# Patient Record
Sex: Male | Born: 1960 | Race: White | Hispanic: No | Marital: Single | Smoking: Former smoker
Health system: Southern US, Community
[De-identification: ages and names within clinical notes are randomized; demographics above are authoritative.]

## PROBLEM LIST (undated history)

## (undated) DIAGNOSIS — R112 Nausea with vomiting, unspecified: Secondary | ICD-10-CM

## (undated) DIAGNOSIS — G709 Myoneural disorder, unspecified: Secondary | ICD-10-CM

## (undated) DIAGNOSIS — I10 Essential (primary) hypertension: Secondary | ICD-10-CM

## (undated) DIAGNOSIS — Z9889 Other specified postprocedural states: Secondary | ICD-10-CM

## (undated) DIAGNOSIS — J449 Chronic obstructive pulmonary disease, unspecified: Secondary | ICD-10-CM

## (undated) DIAGNOSIS — G894 Chronic pain syndrome: Secondary | ICD-10-CM

## (undated) DIAGNOSIS — M1990 Unspecified osteoarthritis, unspecified site: Secondary | ICD-10-CM

## (undated) DIAGNOSIS — Z8489 Family history of other specified conditions: Secondary | ICD-10-CM

## (undated) HISTORY — PX: LUMBAR EPIDURAL INJECTION: SHX1980

## (undated) HISTORY — PX: WISDOM TOOTH EXTRACTION: SHX21

---

## 1998-04-27 ENCOUNTER — Encounter: Payer: Self-pay | Admitting: Emergency Medicine

## 1998-04-27 ENCOUNTER — Emergency Department (HOSPITAL_COMMUNITY): Admission: EM | Admit: 1998-04-27 | Discharge: 1998-04-27 | Payer: Self-pay | Admitting: Emergency Medicine

## 2006-12-17 ENCOUNTER — Emergency Department (HOSPITAL_COMMUNITY): Admission: EM | Admit: 2006-12-17 | Discharge: 2006-12-17 | Payer: Self-pay | Admitting: Emergency Medicine

## 2006-12-17 IMAGING — CR DG KNEE COMPLETE 4+V*L*
4 series · 4 of 4 positions shown · non-contrast
Comparison: none

CLINICAL DATA: Left knee pain while lifting.  
 LEFT KNEE ? 4 VIEW:

[view not recorded (1 of 4)]
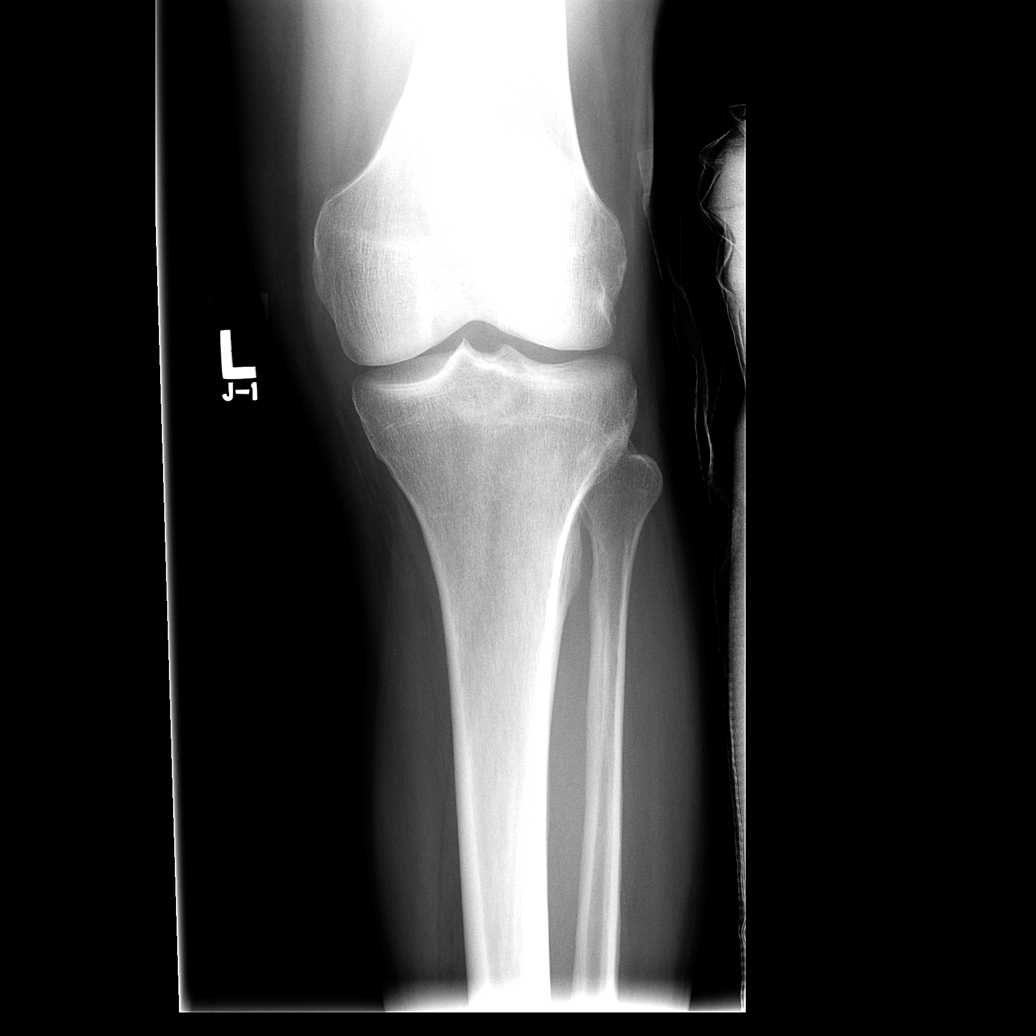

[view not recorded (2 of 4)]
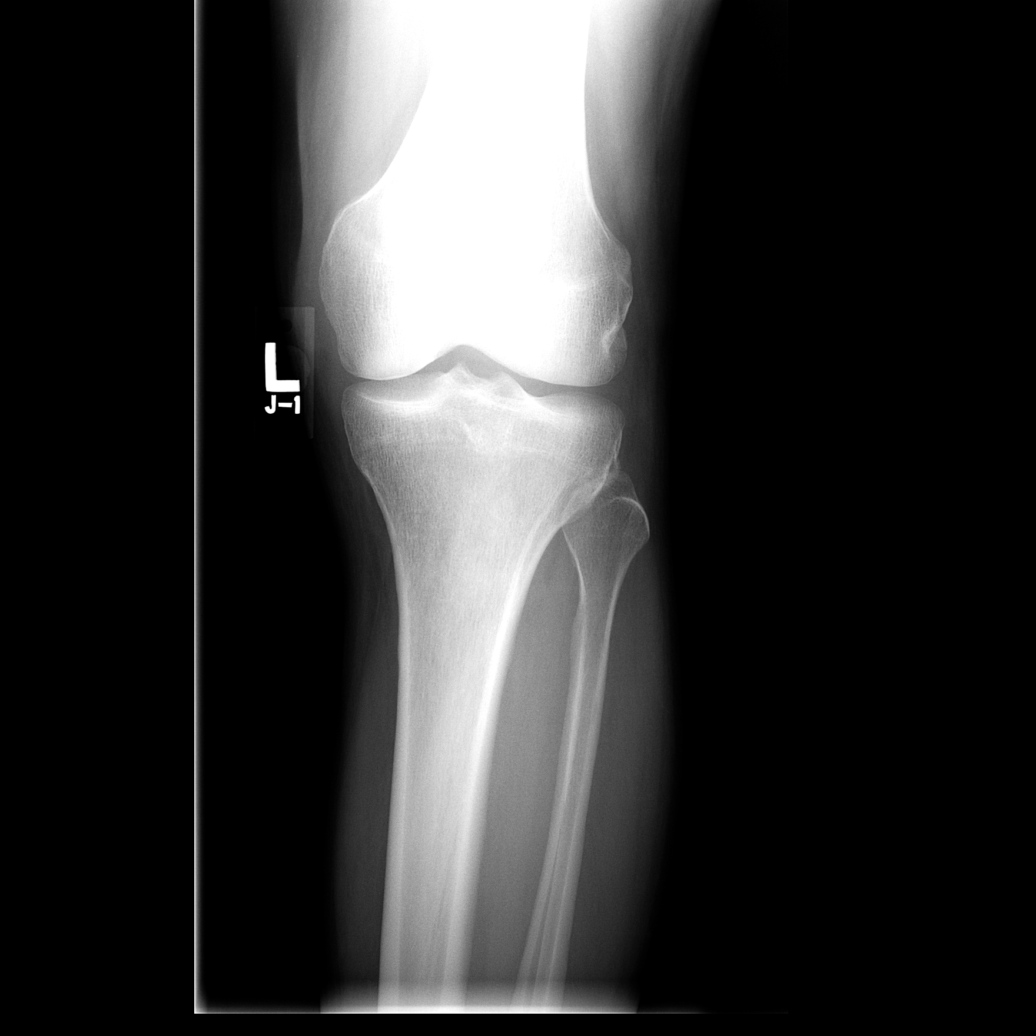

[view not recorded (3 of 4)]
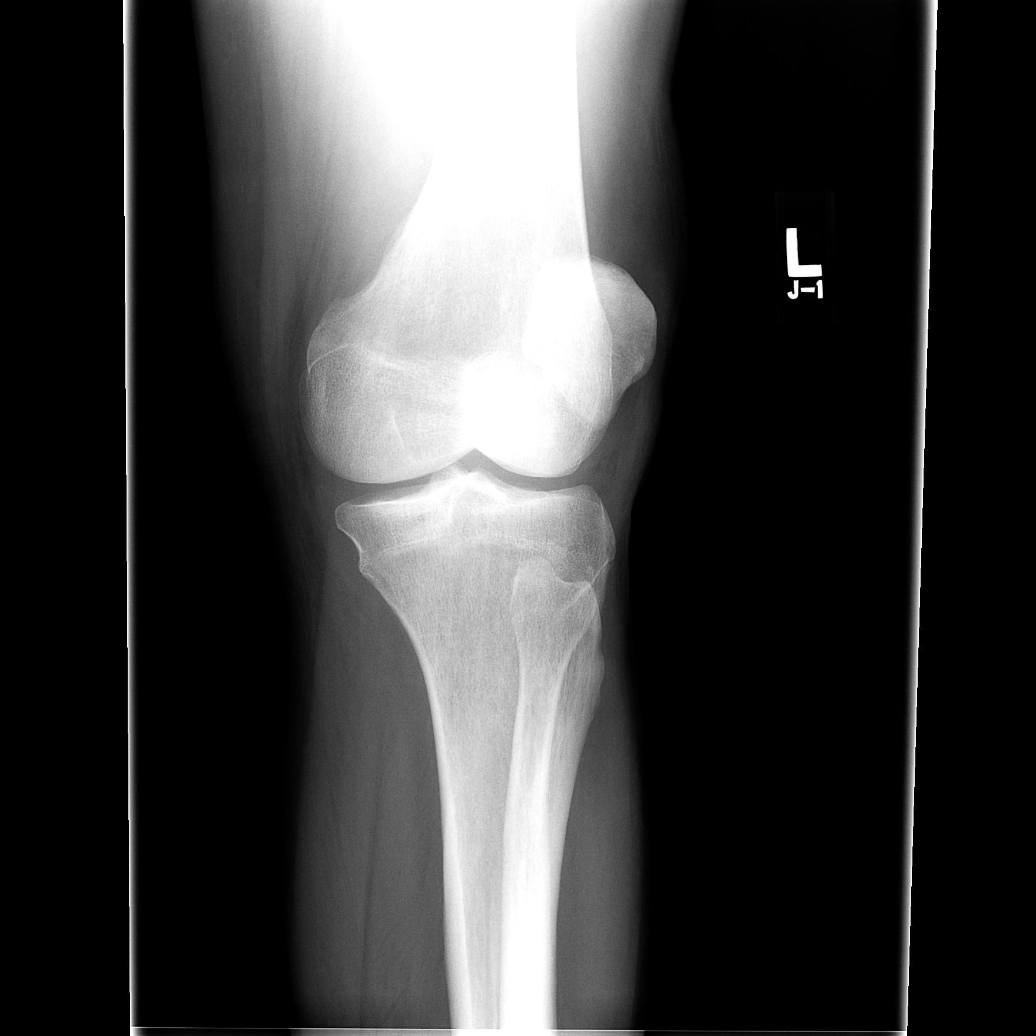

[view not recorded (4 of 4)]
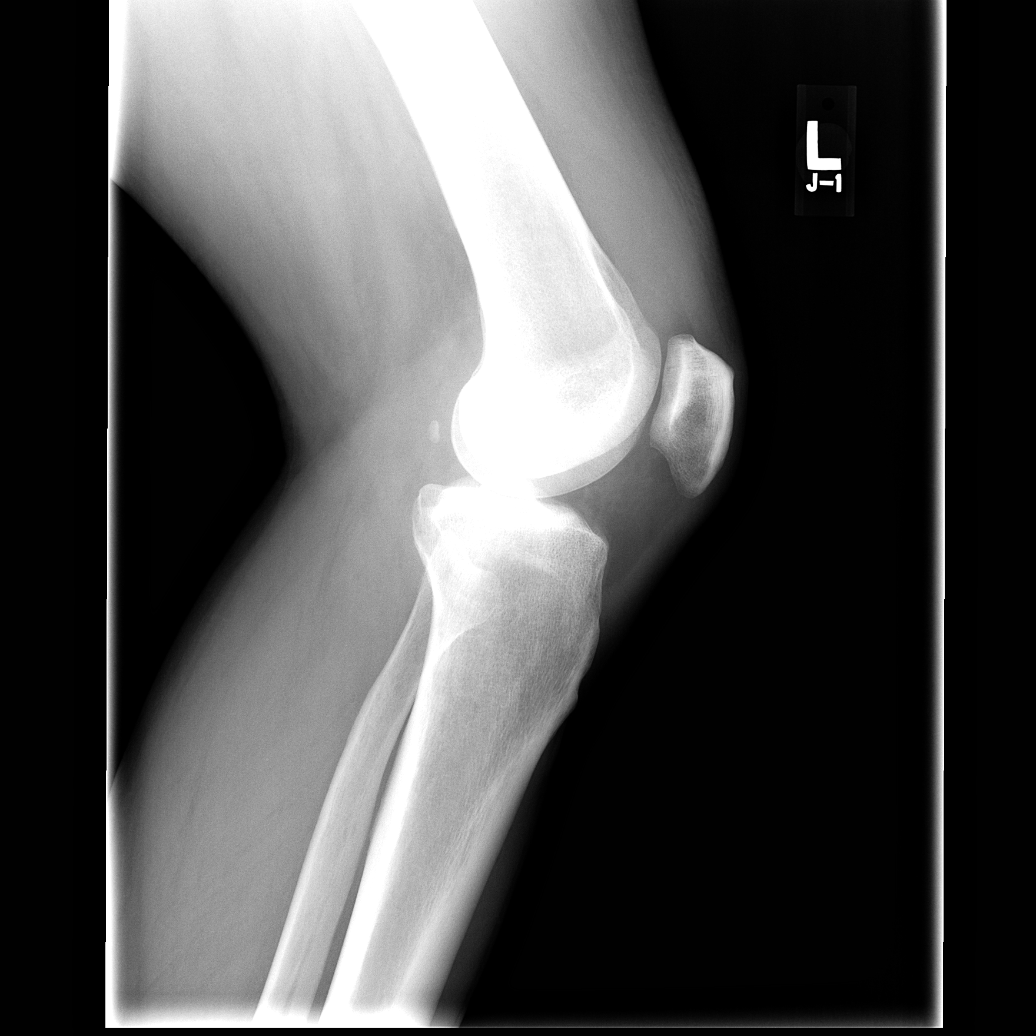

[4 of 4 positions shown; findings below may reference images not displayed]

FINDINGS: Small knee joint effusion is noted.   There is no evidence of fracture or dislocation.  There is no evidence of arthropathy or other bone lesions.
IMPRESSION: 1.  Small knee joint effusion.  
 2.  No bone abnormality identified.

## 2010-11-07 ENCOUNTER — Ambulatory Visit: Payer: Self-pay

## 2010-11-24 ENCOUNTER — Other Ambulatory Visit: Payer: Self-pay | Admitting: Gastroenterology

## 2021-05-30 ENCOUNTER — Other Ambulatory Visit: Payer: Self-pay | Admitting: Neurological Surgery

## 2021-05-30 DIAGNOSIS — M5416 Radiculopathy, lumbar region: Secondary | ICD-10-CM

## 2021-06-12 ENCOUNTER — Ambulatory Visit
Admission: RE | Admit: 2021-06-12 | Discharge: 2021-06-12 | Disposition: A | Discharge status: Home / Self Care | Payer: Self-pay | Source: Ambulatory Visit | Attending: Neurological Surgery | Admitting: Neurological Surgery

## 2021-06-12 DIAGNOSIS — M5416 Radiculopathy, lumbar region: Secondary | ICD-10-CM

## 2021-06-12 IMAGING — MR MR LUMBAR SPINE W/O CM
4 of 5 series · 24 of 48 positions shown · non-contrast
Comparison: No prior MRI, correlation is made with lumbar spine
radiographs [DATE]

CLINICAL DATA: Lower back pain, radiating down right leg and hip.

EXAM:
MRI LUMBAR SPINE WITHOUT CONTRAST
TECHNIQUE: Multiplanar, multisequence MR imaging of the lumbar spine was
performed. No intravenous contrast was administered.

[Series 3: T2 · sagittal · 4.0mm · 0.53mm/px · 6 of 18 slices shown (1 of 2)]
[im 1/18]
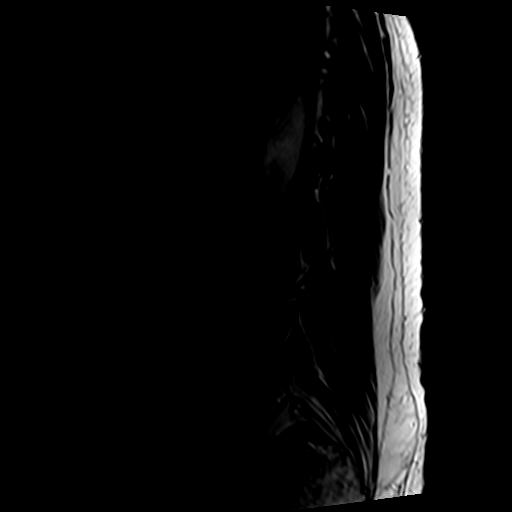
[im 4/18]
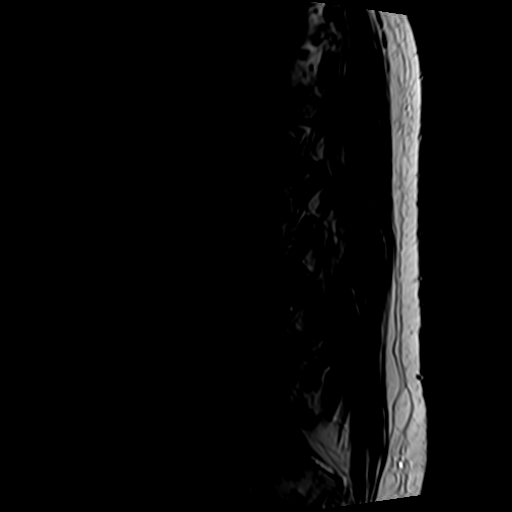
[im 7/18]
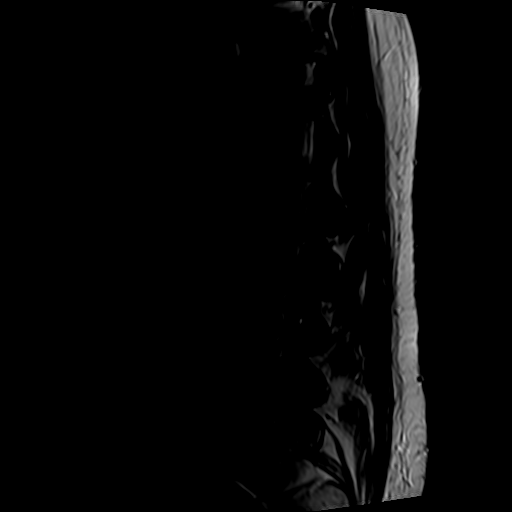
[im 11/18]
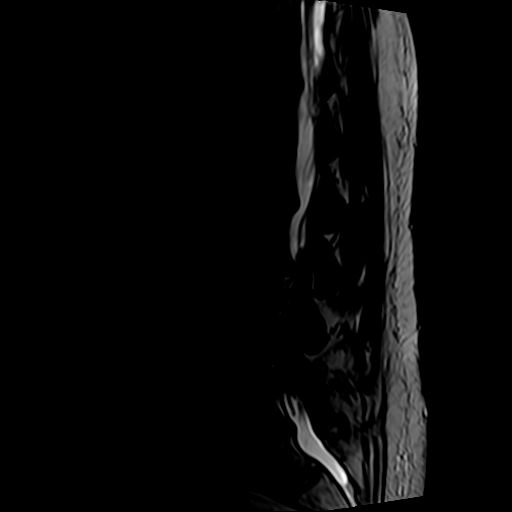
[im 14/18]
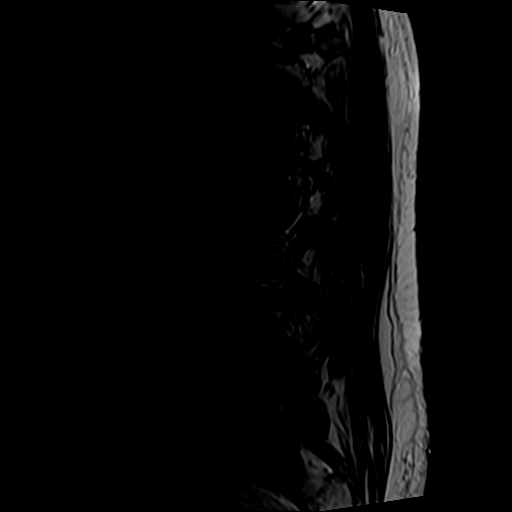
[im 18/18]
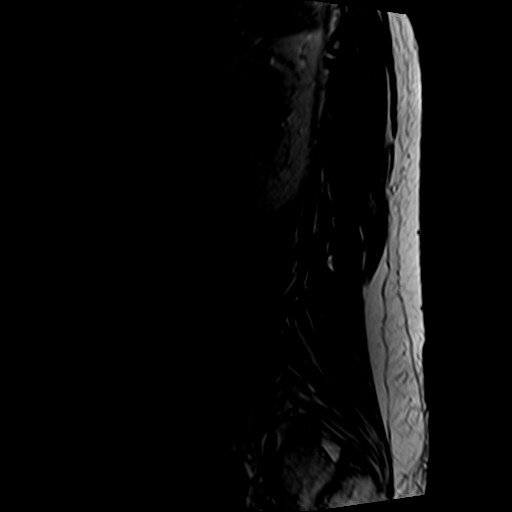

[Series 5: T1 · sagittal · 4.0mm · 0.53mm/px · 7 of 18 slices shown (1 of 2)]
[im 1/18]
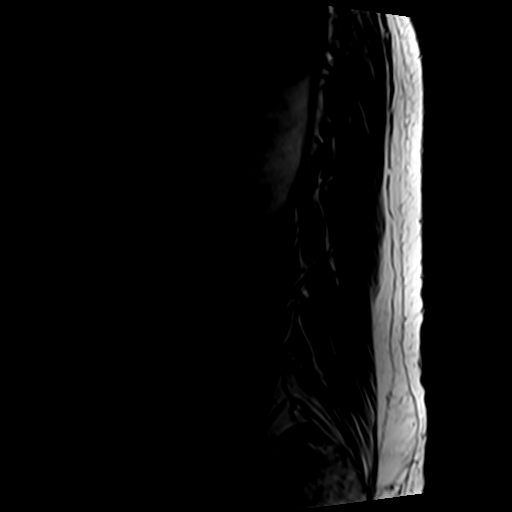
[im 3/18]
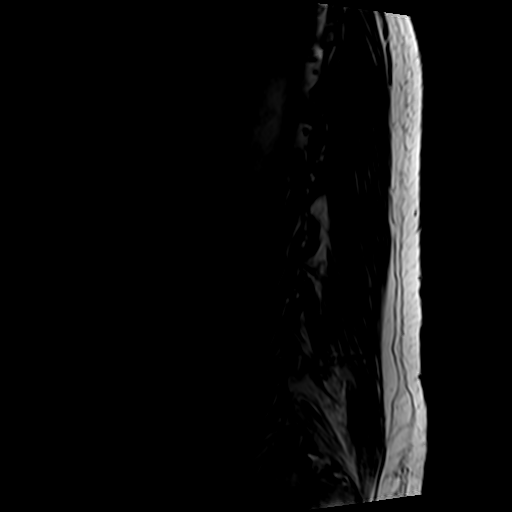
[im 6/18]
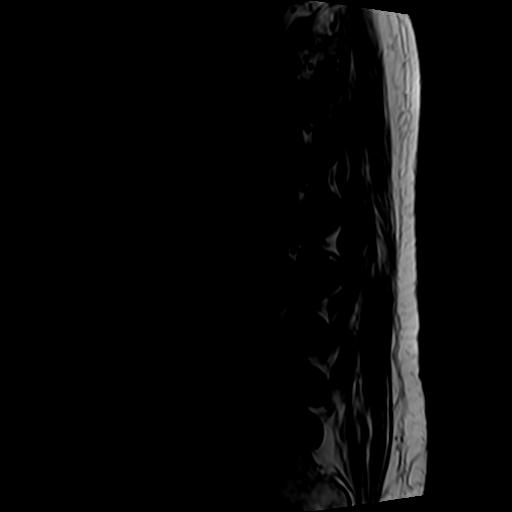
[im 9/18]
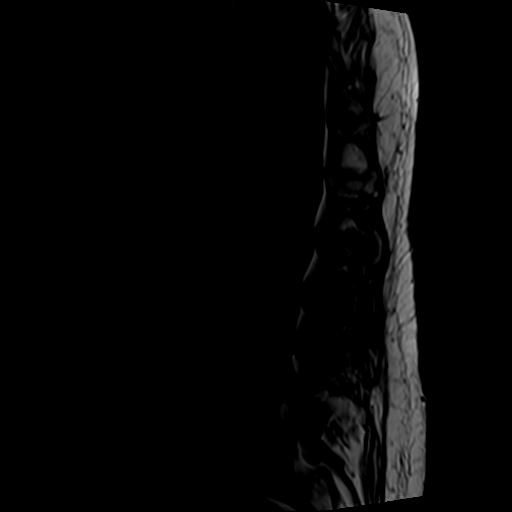
[im 12/18]
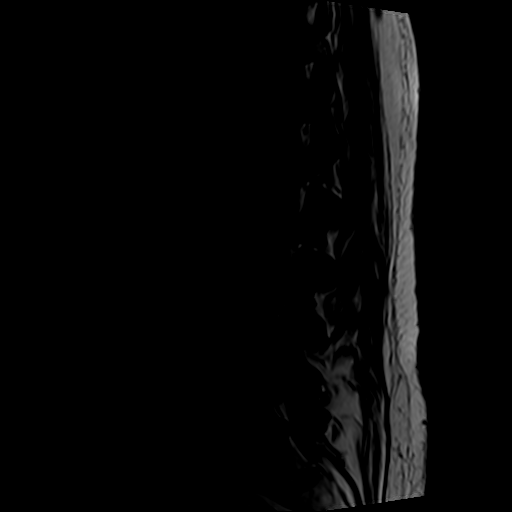
[im 15/18]
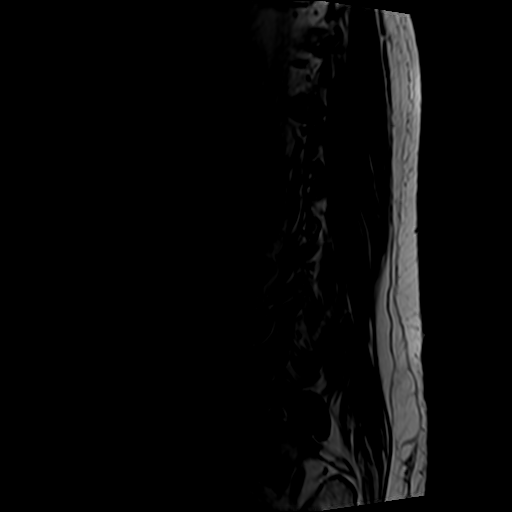
[im 18/18]
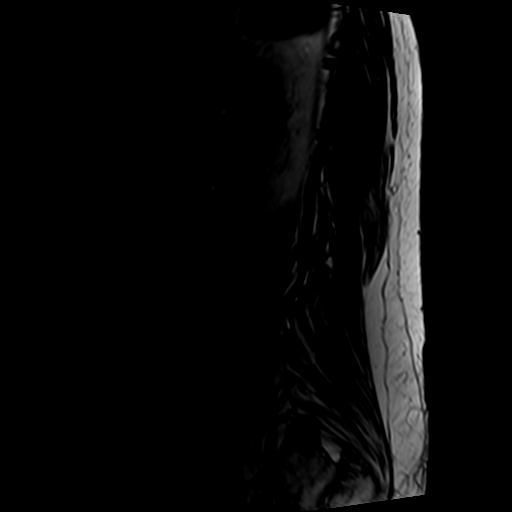

[Series 6: T2 · axial · 4.0mm · 0.70mm/px · z∈[-64,+147]mm · 8 of 35 slices shown (2 of 2)]
[im 1/35]
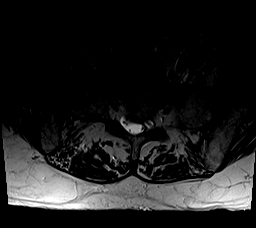
[im 6/35]
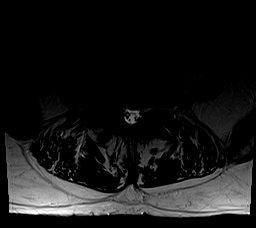
[im 11/35]
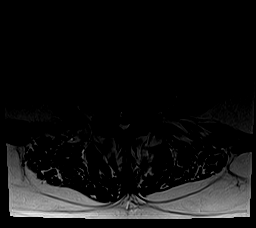
[im 16/35]
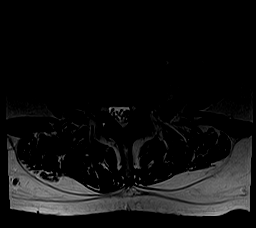
[im 19/35]
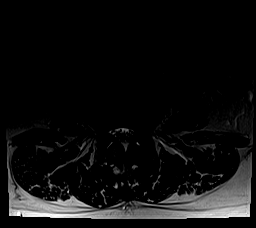
[im 24/35]
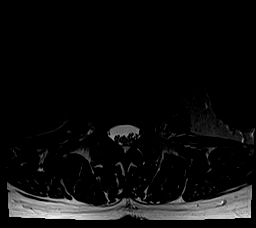
[im 29/35]
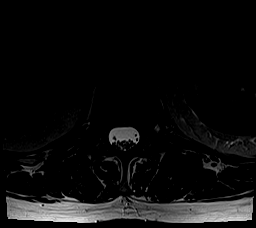
[im 35/35]
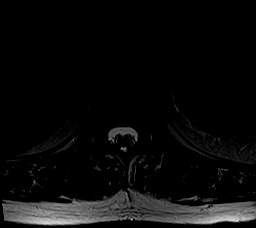

[Series 7: T1 · axial · 4.0mm · 0.35mm/px · z∈[-39,+116]mm · 3 of 35 slices shown (2 of 2)]
[im 6/35]
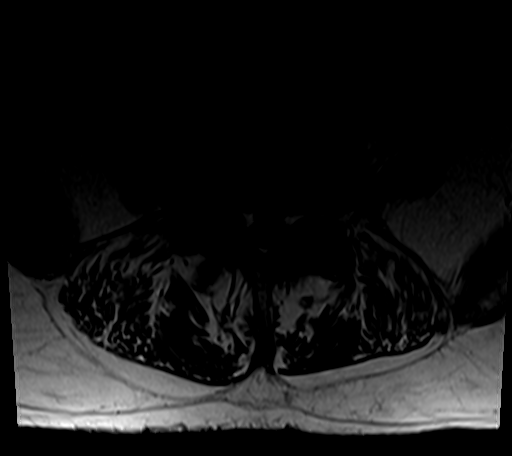
[im 19/35]
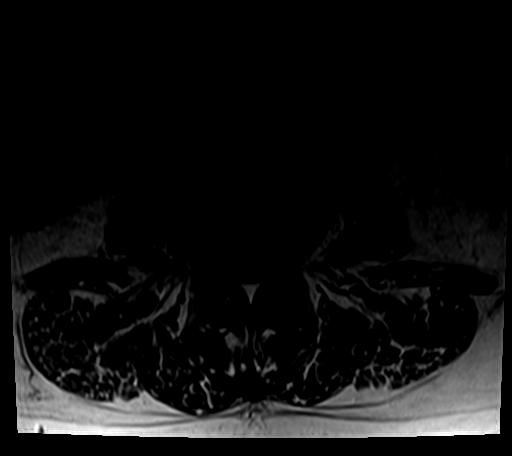
[im 29/35]
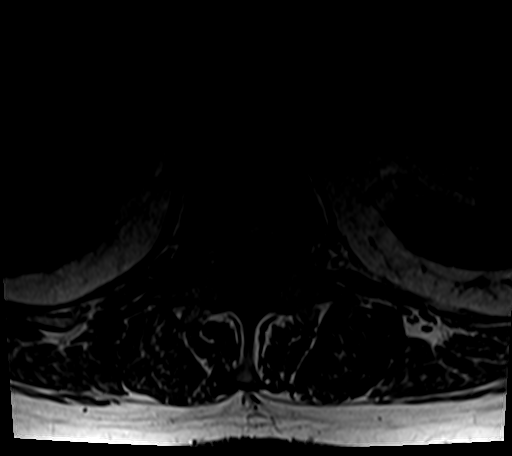

[24 of 48 positions shown; findings below may reference images not displayed]

FINDINGS: Segmentation:  Standard.

Alignment: Stepwise trace retrolisthesis, L1 on L2, L2 on L3, and L3
on L4.

Vertebrae: No fracture, evidence of discitis, or bone lesion. T1 and
T2 hypointense signal in the vertebral bodies at the anterior
aspects of the L2-L3 disc space, with peripheral T2 hyperintense
signal, most likely Modic type 3 endplate changes, indicating
subchondral bony sclerosis. Additional endplate degenerative changes
are also seen L4-L5, likely also Modic type 3.

Conus medullaris and cauda equina: Conus extends to the T12-L1
level. Conus and cauda equina appear normal.

Paraspinal and other soft tissues: Negative.

Disc levels:

T11-T12: Seen only on the sagittal images. No spinal canal stenosis
or neural foraminal narrowing.

T12-L1: No significant disc bulge. Mild facet arthropathy. No spinal
canal stenosis or neural foraminal narrowing.

L1-L2: Disc height loss with mild disc bulge. Moderate facet
arthropathy. No spinal canal stenosis or neural foraminal narrowing.

L2-L3: Disc height loss with broad-based disc bulge. Moderate to
severe facet arthropathy. Mild spinal canal stenosis. No neural
foraminal narrowing.

L3-L4: Disc height loss with broad-based disc bulge. Moderate to
severe facet arthropathy. Mild-to-moderate spinal canal stenosis. No
neural foraminal narrowing. Narrowing of the lateral recesses, left
greater than right.

L4-L5: Severe disc height loss with broad-based disc bulge and
superimposed right paracentral disc extrusion with annular fissure.
Severe facet arthropathy. Severe spinal canal stenosis. Effacement
of the lateral recesses. Moderate to severe right and mild left
neural foraminal narrowing.

L5-S1: Mild disc bulge. Moderate facet arthropathy. Severe right and
mild left neural foraminal narrowing.
IMPRESSION: 1. L4-L5 severe spinal canal stenosis, with moderate to severe right
and mild left neural foraminal narrowing. Effacement of the lateral
recesses at this level likely also affects the descending L5 nerves.
2. L3-L4 mild to moderate spinal canal stenosis. Narrowing of the
left-greater-than-right lateral recess at this level may affect the
descending L4 nerves.
3. L2-L3 mild spinal canal stenosis.
4. L5-S1 severe right and mild left neural foraminal narrowing.

## 2021-08-30 ENCOUNTER — Encounter: Payer: Self-pay | Admitting: Orthopaedic Surgery

## 2021-08-30 ENCOUNTER — Ambulatory Visit (INDEPENDENT_AMBULATORY_CARE_PROVIDER_SITE_OTHER): Payer: 59 | Admitting: Orthopaedic Surgery

## 2021-08-30 ENCOUNTER — Other Ambulatory Visit: Payer: Self-pay

## 2021-08-30 ENCOUNTER — Ambulatory Visit (INDEPENDENT_AMBULATORY_CARE_PROVIDER_SITE_OTHER): Payer: 59

## 2021-08-30 DIAGNOSIS — M87052 Idiopathic aseptic necrosis of left femur: Secondary | ICD-10-CM

## 2021-08-30 DIAGNOSIS — M25552 Pain in left hip: Secondary | ICD-10-CM | POA: Diagnosis not present

## 2021-08-30 NOTE — Progress Notes (Signed)
Office Visit Note   Patient: Carlos Grant           Date of Birth: 1961/06/24           MRN: [id number] Visit Date: 08/30/2021              Requested by: No referring provider defined for this encounter. PCP: Pcp, No   Assessment & Plan: Visit Diagnoses:  1. Pain in left hip   2. Avascular necrosis of hip, left [facility])     Plan: Carlos Grant is 61 years old and is accompanied by his fiance for evaluation of left hip pain.  He has been followed by Dr. Renaye Rakers for avascular necrosis and actually was scheduled for surgery but his insurance changed.  Their office is no longer excepting his surgery so he was referred to our office.  He is also being followed by [name] for lumbar spine issues but has been told that he needs to have the left hip problem resolved before he proceeds with the spine surgery films demonstrate at least 50% collapse of the left femoral head associated with loose bodies and irregularity of the acetabulum consistent with arthritis.  He certainly is a candidate for total hip replacement.  Long discussion regarding all of the above and will refer him to Dr. Magnus Ivan.  He does have lab work within the looks fine except for slightly elevated cholesterol those papers will be available for Dr. Magnus Ivan as well  Follow-Up Instructions: Return Refer to Dr. Magnus Ivan for left total hip replacement.   Orders:  Orders Placed This Encounter  Procedures   XR HIP UNILAT W OR W/O PELVIS 2-3 VIEWS LEFT   Ambulatory referral to Orthopedic Surgery   No orders of the defined types were placed in this encounter.     Procedures: No procedures performed   Clinical Data: No additional findings.   Subjective: Chief Complaint  Patient presents with   Left Hip - Pain  Patient presents today for left hip pain. He has been having hip pain for two years. It has worsened with time. He states that it hurts all the time and all throughout. He was told that he needed a total  hip replacement. He also has lower back pain that is being treated by Dr.Elsner. He takes Ibuprofen and Hydrocodone for pain relief. 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  He denies a specific injury or trauma but notes that he is played a lot of sports in the past and is done a lot of manual labor throughout his lifetime.  He was a heavy drinker at one time but its been over 20 years.  Never had steroid treatments.  Has been told by [name] that he needs to have his hip fixed before he considers spine surgery  HPI  Review of Systems   Objective: Vital Signs: There were no vitals taken for this visit.  Physical Exam Constitutional:      Appearance: He is well-developed.  Pulmonary:     Effort: Pulmonary effort is normal.  Skin:    General: Skin is warm and dry.  Neurological:     Mental Status: He is alert and oriented to person, place, and time.  Psychiatric:        Behavior: Behavior normal.    Ortho Exam awake alert and oriented x3.  Comfortable sitting.  Have an obvious limp referable to his left hip with external rotation of  his left foot on ambulation.  Considerable pain with any internal or external rotation of his left hip.  He is about three quarters of an inch short on the left lower extremity.  Motor exam intact.  Some atrophy of his left thigh and calf consistent with his chronic hip problem  Specialty Comments:  No specialty comments available.  Imaging: XR HIP UNILAT W OR W/O PELVIS 2-3 VIEWS LEFT  Result Date: 08/30/2021 AP pelvis and lateral left hip demonstrates at least 50% collapse of the left femoral head consistent with avascular necrosis associated with advanced osteoarthritis.  There are some small cysts on the acetabular side with some irregularity of the acetabulum as well.  Probable multiple osteocartilaginous loose bodies within the hip joint.    PMFS History: Patient Active Problem List   Diagnosis Date Noted    Avascular necrosis of hip, left (HCC) 08/30/2021   History reviewed. No pertinent past medical history.  History reviewed. No pertinent family history.  History reviewed. No pertinent surgical history. Social History   Occupational History   Not on file  Tobacco Use   Smoking status: Not on file   Smokeless tobacco: Not on file  Substance and Sexual Activity   Alcohol use: Not on file   Drug use: Not on file   Sexual activity: Not on file

## 2021-09-06 ENCOUNTER — Ambulatory Visit (INDEPENDENT_AMBULATORY_CARE_PROVIDER_SITE_OTHER): Payer: 59 | Admitting: Orthopaedic Surgery

## 2021-09-06 ENCOUNTER — Other Ambulatory Visit: Payer: Self-pay

## 2021-09-06 VITALS — Ht 69.0 in | Wt 168.0 lb

## 2021-09-06 DIAGNOSIS — M25552 Pain in left hip: Secondary | ICD-10-CM | POA: Diagnosis not present

## 2021-09-06 DIAGNOSIS — M87052 Idiopathic aseptic necrosis of left femur: Secondary | ICD-10-CM | POA: Diagnosis not present

## 2021-09-06 NOTE — Progress Notes (Signed)
The patient is a 61 year old gentleman sent from Dr. Cleophas Dunker to evaluate and treat avascular necrosis of his left hip with femoral head collapse.  He walks with a significant Trendelenburg gait.  He has been dealing with his left hip pain for some time now.  He was actually seen by one of our colleagues in town who was going to set him up for hip replacement but his practice did not take the patient's insurance so he was sent to our practice.  The patient is a thin individual.  He does have some chronic back issues of the lumbar spine is been seen by [name].  His left hip pain is radiographically worse.  I have reviewed the x-rays of his left hip and it does show severe disease of his left hip consistent with a component of AVN and a component of osteoarthritis.  His right hip appears normal.  He currently denies any headache, chest pain, shortness of breath, fever, chills, nausea, vomiting  I did go over the x-rays with the patient of his pelvis and left hip.  There is complete loss of the joint space of his left hip with femoral head collapse consistent more so with AVN.  There are particular osteophytes as well.  His right hip appears normal.  On exam his right hip moves normally.  His left hip is difficult even attempted rotate due to the severity of his pain.  There is a leg length difference with his leg leg shorter than the right.  He is a thin individual.  He is not a diabetic.  This point I did give him a hip replacement handout and agree that he needs to have this done in the near future in terms of a left hip replacement.  I talked about the risks and benefits of surgery and what to expect from an intraoperative and postoperative course.  We will work on getting this scheduled.  All questions and concerns were answered and addressed.

## 2021-09-22 ENCOUNTER — Other Ambulatory Visit: Payer: Self-pay

## 2021-09-29 ENCOUNTER — Other Ambulatory Visit: Payer: Self-pay | Admitting: Physician Assistant

## 2021-09-29 DIAGNOSIS — M87052 Idiopathic aseptic necrosis of left femur: Secondary | ICD-10-CM

## 2021-10-11 NOTE — Progress Notes (Signed)
Anesthesia Review:  PCP: Cardiologist : Chest x-ray : EKG : Echo : Stress test: Cardiac Cath :  Activity level:  Sleep Study/ CPAP : Fasting Blood Sugar :      / Checks Blood Sugar -- times a day:   Blood Thinner/ Instructions /Last Dose: ASA / Instructions/ Last Dose :   Covid test on 10/18/21

## 2021-10-13 ENCOUNTER — Encounter (HOSPITAL_COMMUNITY)
Admission: RE | Admit: 2021-10-13 | Discharge: 2021-10-13 | Disposition: A | Discharge status: Home / Self Care | Payer: 59 | Source: Ambulatory Visit

## 2021-10-13 NOTE — Progress Notes (Addendum)
Anesthesia Review:  PCP: Carlos Grant LOV 07/06/21 for Annual  Cardiologist : none  Chest x-ray : EKG : Echo : Stress test: Cardiac Cath :  Activity level: can do a flight of stiars without difficulty  Sleep Study/ CPAP : none  Fasting Blood Sugar :      / Checks Blood Sugar -- times a day:   Blood Thinner/ Instructions /Last Dose: ASA / Instructions/ Last Dose :   Covid test on 2/21 at 0930am  Medical hx and preop instructions completed via phone on 10/17/2021.  Bag in lab for pick up on lab and covid appt day of 10/18/21.

## 2021-10-13 NOTE — Progress Notes (Signed)
Your procedure is scheduled on:           10/21/21   Report to [facility] Main  Entrance   Report to admitting at   [phone]     Call this number if you have problems the morning of surgery [phone]    REMEMBER: NO  SOLID FOOD CANDY OR GUM AFTER MIDNIGHT. CLEAR LIQUIDS UNTIL    0415am       . NOTHING BY MOUTH EXCEPT CLEAR LIQUIDS UNTIL  0415am   . PLEASE FINISH ENSURE DRINK PER SURGEON ORDER  WHICH NEEDS TO BE COMPLETED AT    0415am   .      CLEAR LIQUID DIET   Foods Allowed                                                                    Coffee and tea, regular and decaf                            Fruit ices (not with fruit pulp)                                      Iced Popsicles                                    Carbonated beverages, regular and diet                                    Cranberry, grape and apple juices Sports drinks like Gatorade Lightly seasoned clear broth or consume(fat free) Sugar, honey syrup ___________________________________________________________________      BRUSH YOUR TEETH MORNING OF SURGERY AND RINSE YOUR MOUTH OUT, NO CHEWING GUM CANDY OR MINTS.     Take these medicines the morning of surgery with A SIP OF WATER:  none   DO NOT TAKE ANY DIABETIC MEDICATIONS DAY OF YOUR SURGERY                               You may not have any metal on your body including hair pins and              piercings  Do not wear jewelry, make-up, lotions, powders or perfumes, deodorant             Do not wear nail polish on your fingernails.  Do not shave  48 hours prior to surgery.              Men may shave face and neck.   Do not bring valuables to the hospital. [facility] IS NOT             RESPONSIBLE   FOR VALUABLES.  Contacts, dentures or bridgework may not be worn into surgery.  Leave suitcase in the car. After surgery it may be brought to your room.     Patients discharged the day of surgery  will not be allowed to drive home. IF  YOU ARE HAVING SURGERY AND GOING HOME THE SAME DAY, YOU MUST HAVE AN ADULT TO DRIVE YOU HOME AND BE WITH YOU FOR 24 HOURS. YOU MAY GO HOME BY TAXI OR UBER OR ORTHERWISE, BUT AN ADULT MUST ACCOMPANY YOU HOME AND STAY WITH YOU FOR 24 HOURS.  Name and phone number of your driver:  Special Instructions: N/A              Please read over the following fact sheets you were given: _____________________________________________________________________  [facility] - Preparing for Surgery Before surgery, you can play an important role.  Because skin is not sterile, your skin needs to be as free of germs as possible.  You can reduce the number of germs on your skin by washing with CHG (chlorahexidine gluconate) soap before surgery.  CHG is an antiseptic cleaner which kills germs and bonds with the skin to continue killing germs even after washing. Please DO NOT use if you have an allergy to CHG or antibacterial soaps.  If your skin becomes reddened/irritated stop using the CHG and inform your nurse when you arrive at Short Stay. Do not shave (including legs and underarms) for at least 48 hours prior to the first CHG shower.  You may shave your face/neck. Please follow these instructions carefully:  1.  Shower with CHG Soap the night before surgery and the  morning of Surgery.  2.  If you choose to wash your hair, wash your hair first as usual with your  normal  shampoo.  3.  After you shampoo, rinse your hair and body thoroughly to remove the  shampoo.                           4.  Use CHG as you would any other liquid soap.  You can apply chg directly  to the skin and wash                       Gently with a scrungie or clean washcloth.  5.  Apply the CHG Soap to your body ONLY FROM THE NECK DOWN.   Do not use on face/ open                           Wound or open sores. Avoid contact with eyes, ears mouth and genitals (private parts).                       Wash face,  Genitals (private parts) with your  normal soap.             6.  Wash thoroughly, paying special attention to the area where your surgery  will be performed.  7.  Thoroughly rinse your body with warm water from the neck down.  8.  DO NOT shower/wash with your normal soap after using and rinsing off  the CHG Soap.                9.  Pat yourself dry with a clean towel.            10.  Wear clean pajamas.            11.  Place clean sheets on your bed the night of your first shower and do not  sleep with pets. Day of Surgery : Do  not apply any lotions/deodorants the morning of surgery.  Please wear clean clothes to the hospital/surgery center.  FAILURE TO FOLLOW THESE INSTRUCTIONS MAY RESULT IN THE CANCELLATION OF YOUR SURGERY PATIENT SIGNATURE_________________________________  NURSE SIGNATURE__________________________________  ________________________________________________________________________

## 2021-10-14 NOTE — Progress Notes (Signed)
Attempted to leave voice message for pt in regards to proep on 10/17/21.  PT needs covid test done on 10/19/21.  Unable to leave voice mail.  Was goin to offer pt to come in on 10/18/21 for coivd and labs and to do phone appt for med hx and instructions on 10/17/21 at 100pm.

## 2021-10-17 ENCOUNTER — Encounter (HOSPITAL_COMMUNITY)
Admission: RE | Admit: 2021-10-17 | Discharge: 2021-10-17 | Disposition: A | Discharge status: Home / Self Care | Payer: 59 | Source: Ambulatory Visit

## 2021-10-17 ENCOUNTER — Other Ambulatory Visit: Payer: Self-pay

## 2021-10-17 ENCOUNTER — Encounter (HOSPITAL_COMMUNITY): Payer: Self-pay

## 2021-10-17 DIAGNOSIS — Z01812 Encounter for preprocedural laboratory examination: Secondary | ICD-10-CM | POA: Diagnosis not present

## 2021-10-17 DIAGNOSIS — M87052 Idiopathic aseptic necrosis of left femur: Secondary | ICD-10-CM | POA: Diagnosis not present

## 2021-10-17 DIAGNOSIS — Z01818 Encounter for other preprocedural examination: Secondary | ICD-10-CM

## 2021-10-17 HISTORY — DX: Other specified postprocedural states: Z98.890

## 2021-10-17 HISTORY — DX: Family history of other specified conditions: Z84.89

## 2021-10-17 HISTORY — DX: Nausea with vomiting, unspecified: R11.2

## 2021-10-17 HISTORY — DX: Unspecified osteoarthritis, unspecified site: M19.90

## 2021-10-18 ENCOUNTER — Encounter (HOSPITAL_COMMUNITY)
Admission: RE | Admit: 2021-10-18 | Discharge: 2021-10-18 | Disposition: A | Discharge status: Home / Self Care | Payer: 59 | Source: Ambulatory Visit | Attending: Orthopaedic Surgery | Admitting: Orthopaedic Surgery

## 2021-10-18 ENCOUNTER — Telehealth: Payer: Self-pay | Admitting: Orthopaedic Surgery

## 2021-10-18 DIAGNOSIS — M87052 Idiopathic aseptic necrosis of left femur: Secondary | ICD-10-CM | POA: Insufficient documentation

## 2021-10-18 DIAGNOSIS — Z01812 Encounter for preprocedural laboratory examination: Secondary | ICD-10-CM | POA: Diagnosis not present

## 2021-10-18 DIAGNOSIS — Z20822 Contact with and (suspected) exposure to covid-19: Secondary | ICD-10-CM | POA: Diagnosis not present

## 2021-10-18 DIAGNOSIS — Z01818 Encounter for other preprocedural examination: Secondary | ICD-10-CM

## 2021-10-18 DIAGNOSIS — Z87891 Personal history of nicotine dependence: Secondary | ICD-10-CM | POA: Diagnosis not present

## 2021-10-18 LAB — CBC
HCT: 44.6 % (ref 39.0–52.0)
Hemoglobin: 14.8 g/dL (ref 13.0–17.0)
MCH: 31 pg (ref 26.0–34.0)
MCHC: 33.2 g/dL (ref 30.0–36.0)
MCV: 93.3 fL (ref 80.0–100.0)
Platelets: 298 10*3/uL (ref 150–400)
RBC: 4.78 MIL/uL (ref 4.22–5.81)
RDW: 13.1 % (ref 11.5–15.5)
WBC: 10.2 10*3/uL (ref 4.0–10.5)
nRBC: 0 % (ref 0.0–0.2)

## 2021-10-18 LAB — SURGICAL PCR SCREEN
MRSA, PCR: NEGATIVE
Staphylococcus aureus: NEGATIVE

## 2021-10-18 LAB — SARS CORONAVIRUS 2 (TAT 6-24 HRS): SARS Coronavirus 2: NEGATIVE

## 2021-10-18 NOTE — Telephone Encounter (Signed)
Pt called requesting a call back about medications to continue before upcoming surgery this week. Please call pt as soon as possible at [phone].

## 2021-10-18 NOTE — Telephone Encounter (Signed)
Called pt and advised. He stated understanding  °

## 2021-10-18 NOTE — Telephone Encounter (Signed)
Please advise 

## 2021-10-20 NOTE — H&P (Signed)
TOTAL HIP ADMISSION H&P  Patient is admitted for left total hip arthroplasty.  Subjective:  Chief Complaint: left hip pain  HPI: Carlos Grant, 61 y.o. male, has a history of pain and functional disability in the left hip(s) due to  AVN  and patient has failed non-surgical conservative treatments for greater than 12 weeks to include NSAID's and/or analgesics, use of assistive devices, and activity modification.  Onset of symptoms was gradual starting 2 years ago with rapidlly worsening course since that time.The patient noted no past surgery on the left hip(s).  Patient currently rates pain in the left hip at 10 out of 10 with activity. Patient has night pain, worsening of pain with activity and weight bearing, trendelenberg gait, pain that interfers with activities of daily living, and pain with passive range of motion. Patient has evidence of subchondral cysts, joint space narrowing, and femoral head colapse  by imaging studies. This condition presents safety issues increasing the risk of falls. This patient has had avascular necrosis of the hip, acetabular fracture, hip dysplasia.  There is no current active infection.  Patient Active Problem List   Diagnosis Date Noted   Avascular necrosis of hip, left (HCC) 08/30/2021   Past Medical History:  Diagnosis Date   Arthritis    Family history of adverse reaction to anesthesia    PONV (postoperative nausea and vomiting)     No past surgical history on file.  No current facility-administered medications for this encounter.   Current Outpatient Medications  Medication Sig Dispense Refill Last Dose   HYDROcodone-acetaminophen (NORCO/VICODIN) 5-325 MG tablet Take 1.5 tablets by mouth daily.      ibuprofen (ADVIL) 200 MG tablet Take 800 mg by mouth 2 (two) times daily.      No Known Allergies  Social History   Tobacco Use   Smoking status: Former    Types: Cigarettes   Smokeless tobacco: Never  Substance Use Topics   Alcohol use: Yes     Comment: occas    No family history on file.   Review of Systems  Musculoskeletal:  Positive for back pain and gait problem.  All other systems reviewed and are negative.  Objective:  Physical Exam Vitals reviewed.  Constitutional:      Appearance: Normal appearance.  HENT:     Head: Normocephalic and atraumatic.  Eyes:     Extraocular Movements: Extraocular movements intact.     Pupils: Pupils are equal, round, and reactive to light.  Cardiovascular:     Rate and Rhythm: Normal rate and regular rhythm.  Pulmonary:     Effort: Pulmonary effort is normal.     Breath sounds: Normal breath sounds.  Abdominal:     Palpations: Abdomen is soft.  Musculoskeletal:     Cervical back: Normal range of motion and neck supple.     Left hip: Tenderness and bony tenderness present. Decreased range of motion. Decreased strength.  Neurological:     Mental Status: He is alert and oriented to person, place, and time.  Psychiatric:        Behavior: Behavior normal.    Vital signs in last 24 hours:    Labs:   Estimated body mass index is 24.11 kg/m as calculated from the following:   Height as of 10/18/21: 5\' 10"  (1.778 m).   Weight as of 10/18/21: 76.2 kg.   Imaging Review Plain radiographs demonstrate severe avascular necrosis of the left hip(s). The bone quality appears to be good for age and  reported activity level.      Assessment/Plan:  Avascular necrosis, left hip(s)  The patient history, physical examination, clinical judgement of the provider and imaging studies are consistent with end stage AVN of the left hip(s) and total hip arthroplasty is deemed medically necessary. The treatment options including medical management, injection therapy, arthroscopy and arthroplasty were discussed at length. The risks and benefits of total hip arthroplasty were presented and reviewed. The risks due to aseptic loosening, infection, stiffness, dislocation/subluxation,  thromboembolic  complications and other imponderables were discussed.  The patient acknowledged the explanation, agreed to proceed with the plan and consent was signed. Patient is being admitted for inpatient treatment for surgery, pain control, PT, OT, prophylactic antibiotics, VTE prophylaxis, progressive ambulation and ADL's and discharge planning.The patient is planning to be discharged home with home health services

## 2021-10-21 ENCOUNTER — Ambulatory Visit (HOSPITAL_COMMUNITY): Payer: 59 | Admitting: Physician Assistant

## 2021-10-21 ENCOUNTER — Observation Stay (HOSPITAL_COMMUNITY): Payer: 59

## 2021-10-21 ENCOUNTER — Ambulatory Visit (HOSPITAL_BASED_OUTPATIENT_CLINIC_OR_DEPARTMENT_OTHER): Payer: 59 | Admitting: Anesthesiology

## 2021-10-21 ENCOUNTER — Encounter (HOSPITAL_COMMUNITY): Payer: Self-pay | Admitting: Orthopaedic Surgery

## 2021-10-21 ENCOUNTER — Other Ambulatory Visit: Payer: Self-pay

## 2021-10-21 ENCOUNTER — Ambulatory Visit (HOSPITAL_COMMUNITY): Payer: 59

## 2021-10-21 ENCOUNTER — Ambulatory Visit (HOSPITAL_COMMUNITY)
Admission: RE | Admit: 2021-10-21 | Discharge: 2021-10-22 | Disposition: A | Discharge status: Home Health | Payer: 59 | Attending: Orthopaedic Surgery | Admitting: Orthopaedic Surgery

## 2021-10-21 ENCOUNTER — Encounter (HOSPITAL_COMMUNITY)
Admission: RE | Disposition: A | Discharge status: Home Health | Payer: Self-pay | Source: Home / Self Care | Attending: Orthopaedic Surgery

## 2021-10-21 DIAGNOSIS — Z87891 Personal history of nicotine dependence: Secondary | ICD-10-CM | POA: Insufficient documentation

## 2021-10-21 DIAGNOSIS — Z20822 Contact with and (suspected) exposure to covid-19: Secondary | ICD-10-CM | POA: Insufficient documentation

## 2021-10-21 DIAGNOSIS — Z01812 Encounter for preprocedural laboratory examination: Secondary | ICD-10-CM

## 2021-10-21 DIAGNOSIS — M87052 Idiopathic aseptic necrosis of left femur: Secondary | ICD-10-CM

## 2021-10-21 DIAGNOSIS — Z419 Encounter for procedure for purposes other than remedying health state, unspecified: Secondary | ICD-10-CM

## 2021-10-21 DIAGNOSIS — M87852 Other osteonecrosis, left femur: Secondary | ICD-10-CM

## 2021-10-21 DIAGNOSIS — M1612 Unilateral primary osteoarthritis, left hip: Secondary | ICD-10-CM | POA: Diagnosis not present

## 2021-10-21 DIAGNOSIS — Z96642 Presence of left artificial hip joint: Secondary | ICD-10-CM

## 2021-10-21 HISTORY — PX: TOTAL HIP ARTHROPLASTY: SHX124

## 2021-10-21 LAB — TYPE AND SCREEN
ABO/RH(D): O POS
Antibody Screen: NEGATIVE

## 2021-10-21 LAB — ABO/RH: ABO/RH(D): O POS

## 2021-10-21 IMAGING — RF DG HIP (WITH OR WITHOUT PELVIS) 1V*L*
1 series · 6 of 6 positions shown · non-contrast
Comparison: Hip radiographs [DATE]

CLINICAL DATA: Left hip replacement

EXAM:
DG HIP (WITH OR WITHOUT PELVIS) 1V*L*

[Series 1: unknown protocol · 0.20mm/px · 6 of 6 slices shown]
[im 1/6]
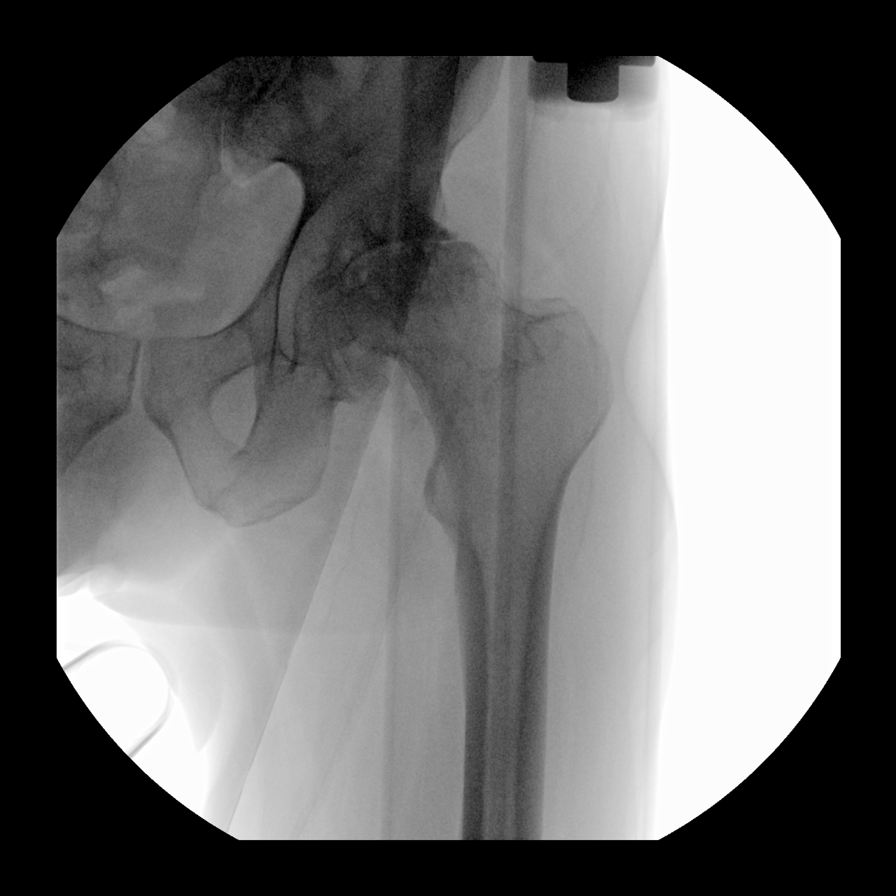
[im 2/6]
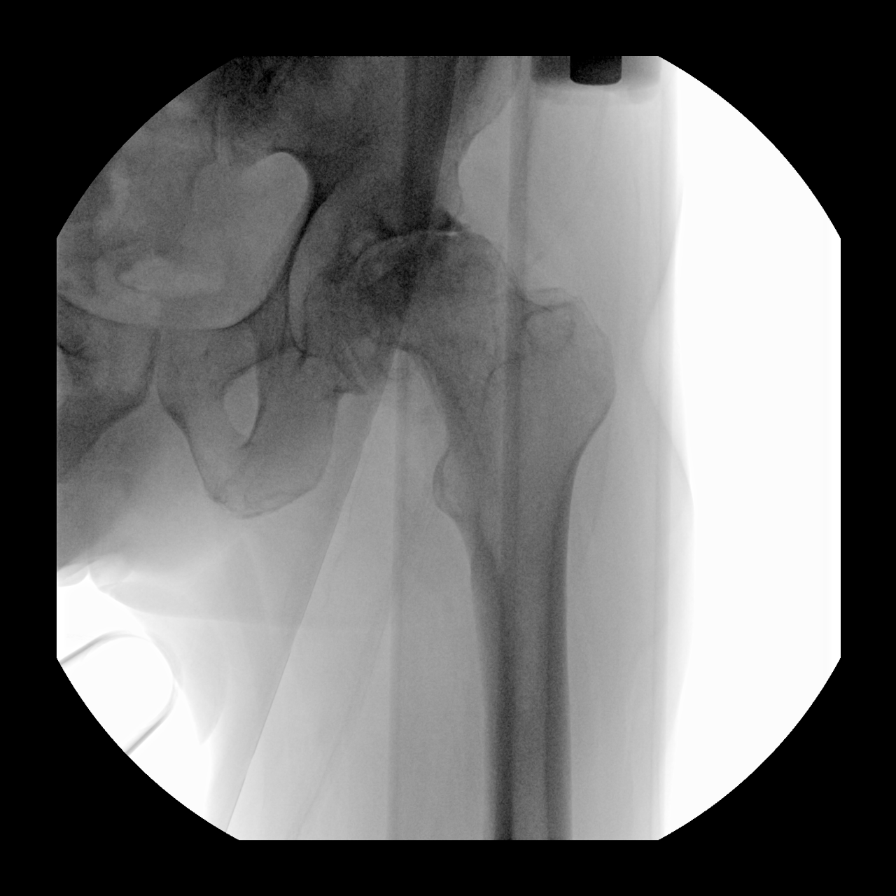
[im 3/6]
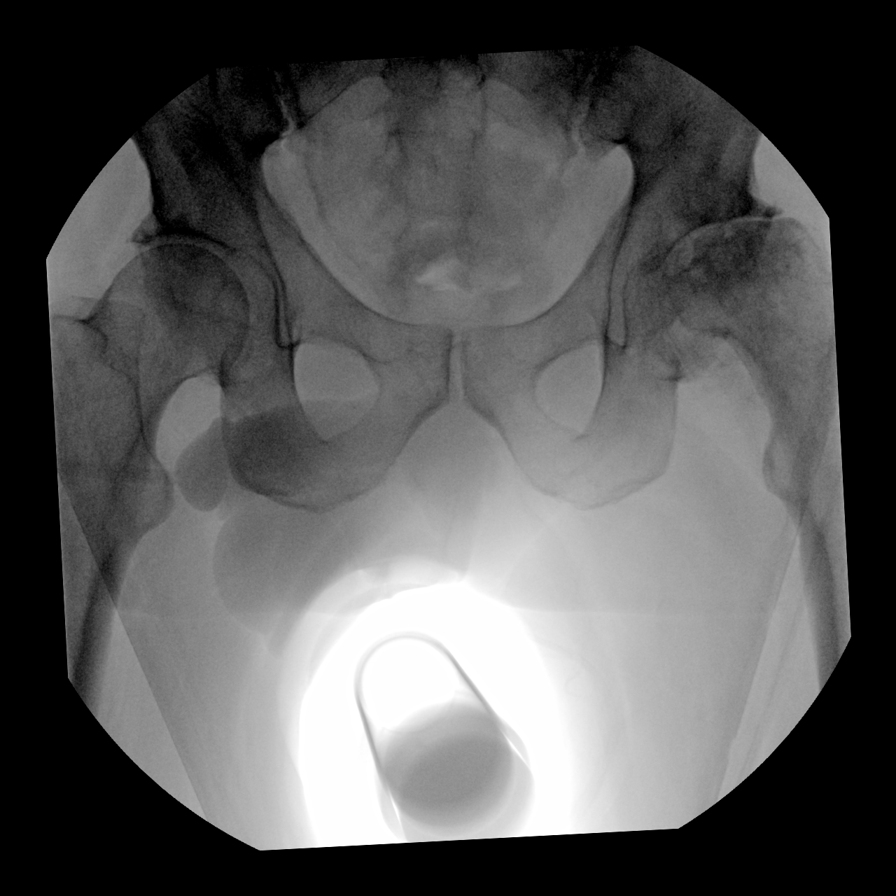
[im 4/6]
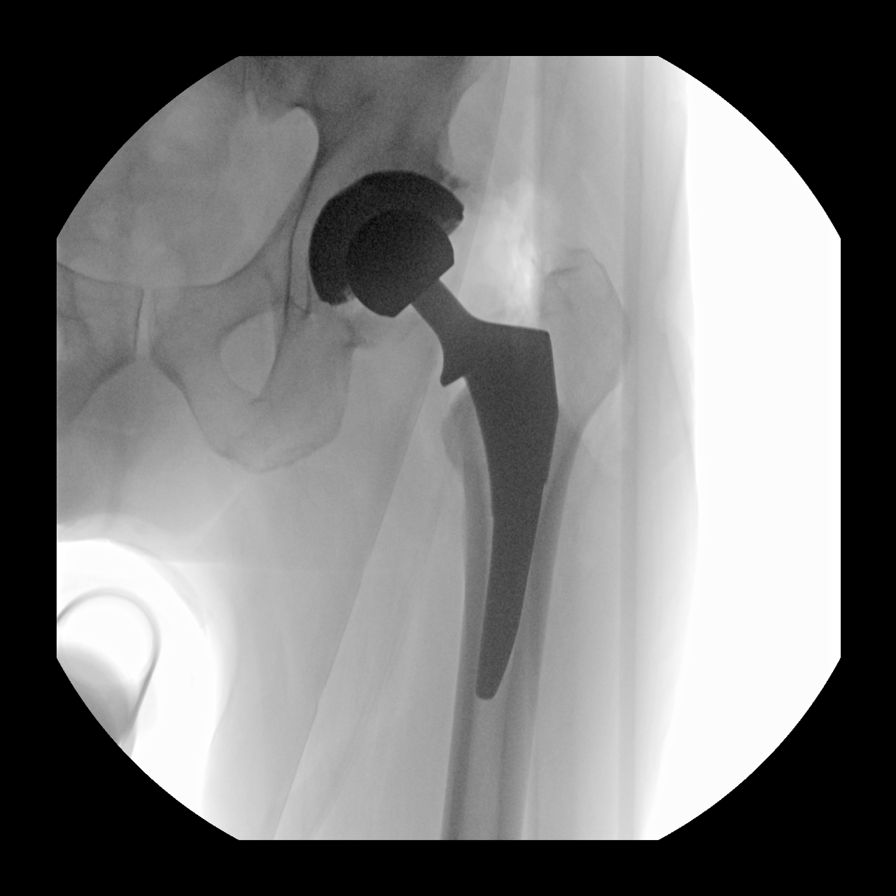
[im 5/6]
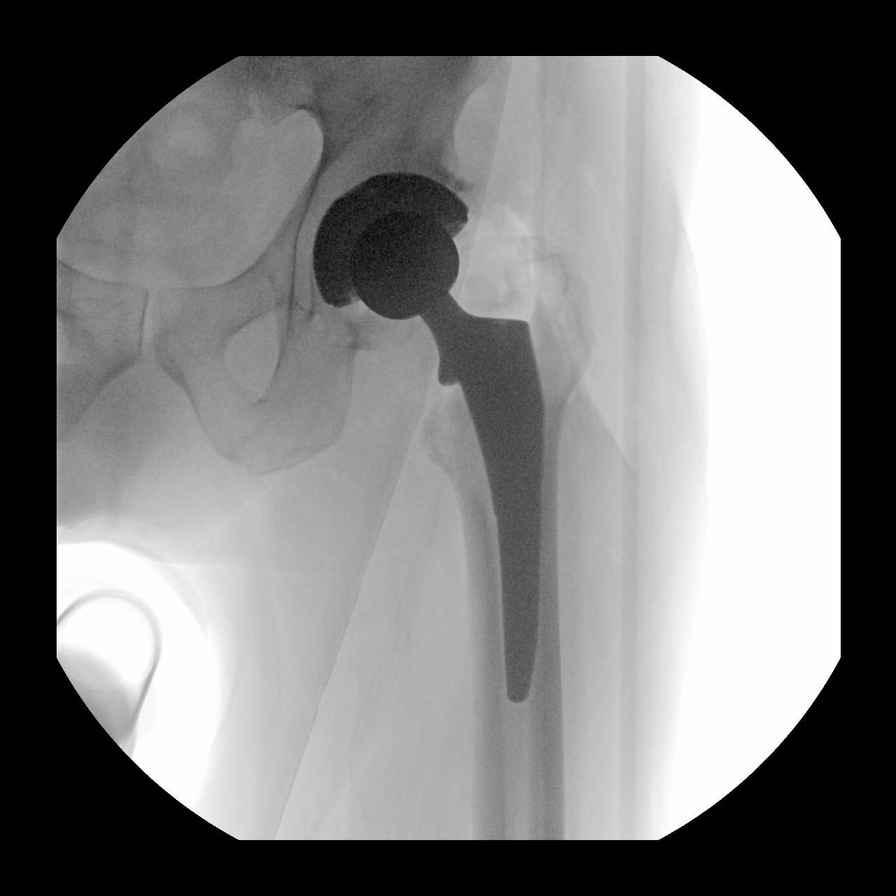
[im 6/6]
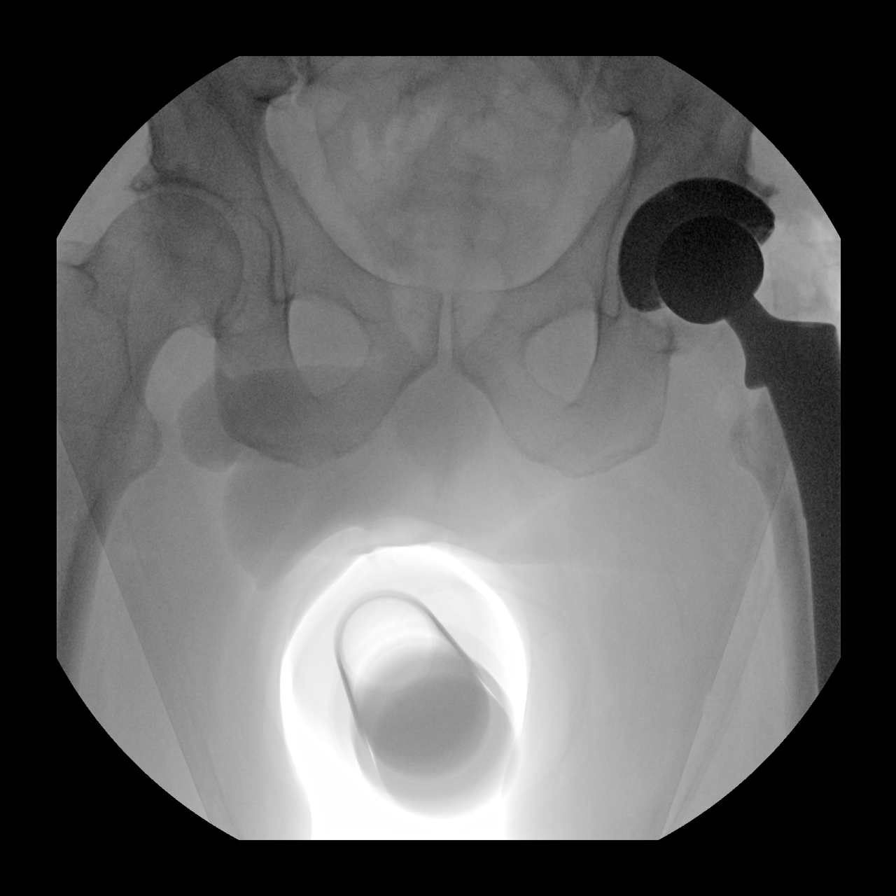

[6 of 6 positions shown; findings below may reference images not displayed]

FINDINGS: Six C-arm fluoroscopic images were obtained intraoperatively and
submitted for post operative interpretation. Initial images
demonstrate marked degenerative change about the left hip,
unchanged. Subsequent images demonstrate postsurgical changes
reflecting left hip arthroplasty. Hardware alignment is within
expected limits, without evidence of complication. Please see the
performing provider's procedural report for further detail.
IMPRESSION: Status post left hip replacement without evidence of immediate
complication.

## 2021-10-21 IMAGING — DX DG PORTABLE PELVIS
1 series · 1 of 1 positions shown · non-contrast
Comparison: Intraoperative radiographs obtained earlier the same
day, preoperative radiographs [DATE]

CLINICAL DATA: Status post left hip arthroplasty

EXAM:
PORTABLE PELVIS 1-2 VIEWS

[pelvis ap]
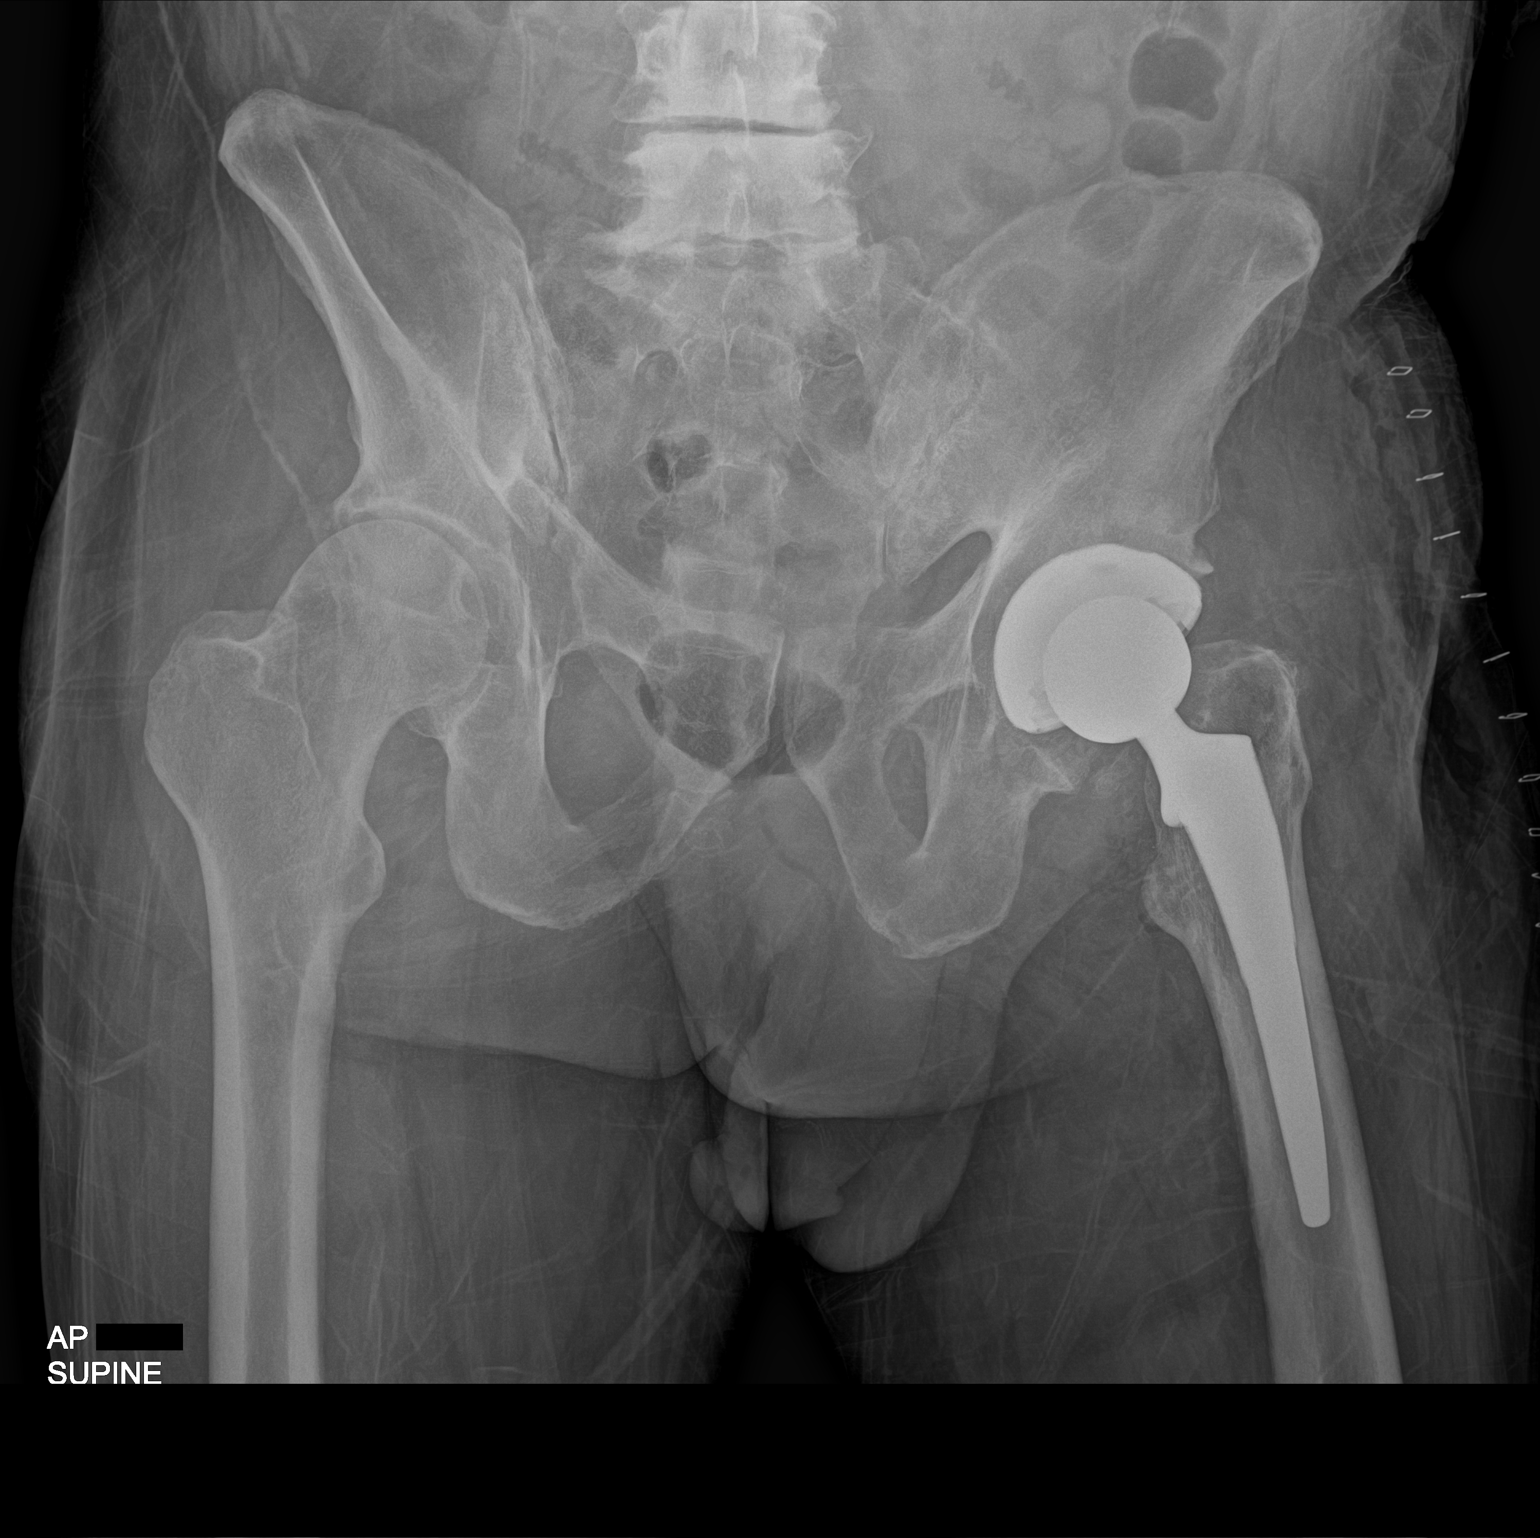

[1 of 1 positions shown; findings below may reference images not displayed]

FINDINGS: Postsurgical changes reflecting left hip arthroplasty are seen.
Hardware alignment is within expected limits, without evidence of
complication. There is expected surrounding postoperative soft
tissue gas. There is degenerative change in the lower lumbar spine.
IMPRESSION: Post left hip replacement without evidence of immediate
complication.

## 2021-10-21 SURGERY — ARTHROPLASTY, HIP, TOTAL, ANTERIOR APPROACH
Anesthesia: Spinal | Site: Hip | Laterality: Left

## 2021-10-21 MED ORDER — ACETAMINOPHEN 500 MG PO TABS
ORAL_TABLET | ORAL | Status: AC
  Filled 2021-10-21: qty 2

## 2021-10-21 MED ORDER — METHOCARBAMOL 500 MG IVPB - SIMPLE MED
500.0000 mg | Freq: Four times a day (QID) | INTRAVENOUS | Status: DC | PRN
  Filled 2021-10-21: qty 50

## 2021-10-21 MED ORDER — OXYCODONE HCL 5 MG PO TABS: 5.0000 mg | ORAL_TABLET | Freq: Once | ORAL | Status: DC | PRN

## 2021-10-21 MED ORDER — MENTHOL 3 MG MT LOZG: 1.0000 | LOZENGE | OROMUCOSAL | Status: DC | PRN

## 2021-10-21 MED ORDER — ASPIRIN 81 MG PO CHEW
81.0000 mg | CHEWABLE_TABLET | Freq: Two times a day (BID) | ORAL | Status: DC
  Administered 2021-10-21 – 2021-10-22 (×2): 81 mg via ORAL
  Filled 2021-10-21 (×2): qty 1

## 2021-10-21 MED ORDER — SODIUM CHLORIDE 0.9 % IV SOLN: INTRAVENOUS | Status: DC

## 2021-10-21 MED ORDER — OXYCODONE HCL 5 MG/5ML PO SOLN: 5.0000 mg | Freq: Once | ORAL | Status: DC | PRN

## 2021-10-21 MED ORDER — PROPOFOL 10 MG/ML IV BOLUS
INTRAVENOUS | Status: DC | PRN
  Administered 2021-10-21 (×2): 40 mg via INTRAVENOUS

## 2021-10-21 MED ORDER — PHENYLEPHRINE 40 MCG/ML (10ML) SYRINGE FOR IV PUSH (FOR BLOOD PRESSURE SUPPORT)
PREFILLED_SYRINGE | INTRAVENOUS | Status: AC
  Filled 2021-10-21: qty 10

## 2021-10-21 MED ORDER — OXYCODONE HCL 5 MG PO TABS
10.0000 mg | ORAL_TABLET | ORAL | Status: DC | PRN
  Administered 2021-10-21 – 2021-10-22 (×3): 10 mg via ORAL
  Filled 2021-10-21: qty 2

## 2021-10-21 MED ORDER — MIDAZOLAM HCL 2 MG/2ML IJ SOLN
INTRAMUSCULAR | Status: DC | PRN
  Administered 2021-10-21: 2 mg via INTRAVENOUS

## 2021-10-21 MED ORDER — CEFAZOLIN SODIUM-DEXTROSE 2-4 GM/100ML-% IV SOLN
2.0000 g | INTRAVENOUS | Status: AC
  Administered 2021-10-21: 2 g via INTRAVENOUS
  Filled 2021-10-21: qty 100

## 2021-10-21 MED ORDER — PHENOL 1.4 % MT LIQD: 1.0000 | OROMUCOSAL | Status: DC | PRN

## 2021-10-21 MED ORDER — CEFAZOLIN SODIUM-DEXTROSE 1-4 GM/50ML-% IV SOLN
1.0000 g | Freq: Four times a day (QID) | INTRAVENOUS | Status: AC
  Administered 2021-10-21 (×2): 1 g via INTRAVENOUS
  Filled 2021-10-21 (×2): qty 50

## 2021-10-21 MED ORDER — ONDANSETRON HCL 4 MG/2ML IJ SOLN: 4.0000 mg | Freq: Once | INTRAMUSCULAR | Status: DC | PRN

## 2021-10-21 MED ORDER — PROPOFOL 1000 MG/100ML IV EMUL
INTRAVENOUS | Status: AC
  Filled 2021-10-21: qty 100

## 2021-10-21 MED ORDER — DOCUSATE SODIUM 100 MG PO CAPS
100.0000 mg | ORAL_CAPSULE | Freq: Two times a day (BID) | ORAL | Status: DC
  Administered 2021-10-21: 100 mg via ORAL
  Filled 2021-10-21 (×2): qty 1

## 2021-10-21 MED ORDER — FENTANYL CITRATE PF 50 MCG/ML IJ SOSY: 25.0000 ug | PREFILLED_SYRINGE | INTRAMUSCULAR | Status: DC | PRN

## 2021-10-21 MED ORDER — HYDROMORPHONE HCL 1 MG/ML IJ SOLN
0.5000 mg | INTRAMUSCULAR | Status: DC | PRN
  Administered 2021-10-21 (×2): 1 mg via INTRAVENOUS
  Filled 2021-10-21 (×2): qty 1

## 2021-10-21 MED ORDER — MIDAZOLAM HCL 2 MG/2ML IJ SOLN
INTRAMUSCULAR | Status: AC
  Filled 2021-10-21: qty 2

## 2021-10-21 MED ORDER — PHENYLEPHRINE 40 MCG/ML (10ML) SYRINGE FOR IV PUSH (FOR BLOOD PRESSURE SUPPORT)
PREFILLED_SYRINGE | INTRAVENOUS | Status: DC | PRN
  Administered 2021-10-21: 80 ug via INTRAVENOUS

## 2021-10-21 MED ORDER — PANTOPRAZOLE SODIUM 40 MG PO TBEC
40.0000 mg | DELAYED_RELEASE_TABLET | Freq: Every day | ORAL | Status: DC
  Administered 2021-10-21 – 2021-10-22 (×2): 40 mg via ORAL
  Filled 2021-10-21 (×2): qty 1

## 2021-10-21 MED ORDER — DIPHENHYDRAMINE HCL 12.5 MG/5ML PO ELIX
12.5000 mg | ORAL_SOLUTION | ORAL | Status: DC | PRN
  Administered 2021-10-22: 25 mg via ORAL
  Filled 2021-10-21: qty 10

## 2021-10-21 MED ORDER — PHENYLEPHRINE HCL (PRESSORS) 10 MG/ML IV SOLN
INTRAVENOUS | Status: AC
Start: 2021-10-21 — End: ?
  Filled 2021-10-21: qty 1

## 2021-10-21 MED ORDER — STERILE WATER FOR IRRIGATION IR SOLN
Status: DC | PRN
  Administered 2021-10-21 (×2): 1000 mL

## 2021-10-21 MED ORDER — TRANEXAMIC ACID-NACL 1000-0.7 MG/100ML-% IV SOLN
1000.0000 mg | INTRAVENOUS | Status: AC
  Administered 2021-10-21: 1000 mg via INTRAVENOUS
  Filled 2021-10-21: qty 100

## 2021-10-21 MED ORDER — ORAL CARE MOUTH RINSE: 15.0000 mL | Freq: Once | OROMUCOSAL | Status: AC

## 2021-10-21 MED ORDER — SODIUM CHLORIDE 0.9 % IR SOLN
Status: DC | PRN
Start: 2021-10-21 — End: 2021-10-21
  Administered 2021-10-21: 1000 mL

## 2021-10-21 MED ORDER — POVIDONE-IODINE 10 % EX SWAB
2.0000 "application " | Freq: Once | CUTANEOUS | Status: AC
  Administered 2021-10-21: 2 via TOPICAL

## 2021-10-21 MED ORDER — CHLORHEXIDINE GLUCONATE 0.12 % MT SOLN
15.0000 mL | Freq: Once | OROMUCOSAL | Status: AC
  Administered 2021-10-21: 15 mL via OROMUCOSAL

## 2021-10-21 MED ORDER — AMISULPRIDE (ANTIEMETIC) 5 MG/2ML IV SOLN: 10.0000 mg | Freq: Once | INTRAVENOUS | Status: DC | PRN

## 2021-10-21 MED ORDER — DEXAMETHASONE SODIUM PHOSPHATE 10 MG/ML IJ SOLN
INTRAMUSCULAR | Status: DC | PRN
  Administered 2021-10-21: 10 mg via INTRAVENOUS

## 2021-10-21 MED ORDER — ALUM & MAG HYDROXIDE-SIMETH 200-200-20 MG/5ML PO SUSP
30.0000 mL | ORAL | Status: DC | PRN
  Administered 2021-10-22: 30 mL via ORAL
  Filled 2021-10-21: qty 30

## 2021-10-21 MED ORDER — PROPOFOL 500 MG/50ML IV EMUL
INTRAVENOUS | Status: DC | PRN
  Administered 2021-10-21: 125 ug/kg/min via INTRAVENOUS

## 2021-10-21 MED ORDER — PHENYLEPHRINE HCL-NACL 20-0.9 MG/250ML-% IV SOLN
INTRAVENOUS | Status: DC | PRN
  Administered 2021-10-21: 40 ug/min via INTRAVENOUS

## 2021-10-21 MED ORDER — OXYCODONE HCL 5 MG PO TABS
5.0000 mg | ORAL_TABLET | ORAL | Status: DC | PRN
  Administered 2021-10-21 – 2021-10-22 (×2): 10 mg via ORAL
  Filled 2021-10-21 (×4): qty 2

## 2021-10-21 MED ORDER — ONDANSETRON HCL 4 MG/2ML IJ SOLN: 4.0000 mg | Freq: Four times a day (QID) | INTRAMUSCULAR | Status: DC | PRN

## 2021-10-21 MED ORDER — ONDANSETRON HCL 4 MG PO TABS: 4.0000 mg | ORAL_TABLET | Freq: Four times a day (QID) | ORAL | Status: DC | PRN

## 2021-10-21 MED ORDER — 0.9 % SODIUM CHLORIDE (POUR BTL) OPTIME
TOPICAL | Status: DC | PRN
  Administered 2021-10-21: 1000 mL

## 2021-10-21 MED ORDER — LACTATED RINGERS IV SOLN
INTRAVENOUS | Status: DC
Start: 2021-10-21 — End: 2021-10-21

## 2021-10-21 MED ORDER — ACETAMINOPHEN 500 MG PO TABS
1000.0000 mg | ORAL_TABLET | Freq: Once | ORAL | Status: AC
  Administered 2021-10-21: 1000 mg via ORAL

## 2021-10-21 MED ORDER — PHENYLEPHRINE HCL (PRESSORS) 10 MG/ML IV SOLN
INTRAVENOUS | Status: AC
  Filled 2021-10-21: qty 1

## 2021-10-21 MED ORDER — METOCLOPRAMIDE HCL 5 MG/ML IJ SOLN: 5.0000 mg | Freq: Three times a day (TID) | INTRAMUSCULAR | Status: DC | PRN

## 2021-10-21 MED ORDER — DEXAMETHASONE SODIUM PHOSPHATE 10 MG/ML IJ SOLN
INTRAMUSCULAR | Status: AC
  Filled 2021-10-21: qty 1

## 2021-10-21 MED ORDER — METOCLOPRAMIDE HCL 5 MG PO TABS: 5.0000 mg | ORAL_TABLET | Freq: Three times a day (TID) | ORAL | Status: DC | PRN

## 2021-10-21 MED ORDER — ACETAMINOPHEN 325 MG PO TABS: 325.0000 mg | ORAL_TABLET | Freq: Four times a day (QID) | ORAL | Status: DC | PRN

## 2021-10-21 MED ORDER — METHOCARBAMOL 500 MG PO TABS
500.0000 mg | ORAL_TABLET | Freq: Four times a day (QID) | ORAL | Status: DC | PRN
  Administered 2021-10-21: 500 mg via ORAL
  Filled 2021-10-21: qty 1

## 2021-10-21 SURGICAL SUPPLY — 43 items
APL SKNCLS STERI-STRIP NONHPOA (GAUZE/BANDAGES/DRESSINGS)
BAG COUNTER SPONGE SURGICOUNT (BAG) ×2 IMPLANT
BAG SPEC THK2 15X12 ZIP CLS (MISCELLANEOUS)
BAG SPNG CNTER NS LX DISP (BAG) ×1
BAG ZIPLOCK 12X15 (MISCELLANEOUS) IMPLANT
BENZOIN TINCTURE PRP APPL 2/3 (GAUZE/BANDAGES/DRESSINGS) IMPLANT
BLADE SAW SGTL 18X1.27X75 (BLADE) ×2 IMPLANT
COVER PERINEAL POST (MISCELLANEOUS) ×2 IMPLANT
COVER SURGICAL LIGHT HANDLE (MISCELLANEOUS) ×2 IMPLANT
CUP ACET PNNCL SECTR W/GRIP 56 (Hips) IMPLANT
DRAPE FOOT SWITCH (DRAPES) ×2 IMPLANT
DRAPE STERI IOBAN 125X83 (DRAPES) ×2 IMPLANT
DRAPE U-SHAPE 47X51 STRL (DRAPES) ×4 IMPLANT
DRSG AQUACEL AG ADV 3.5X10 (GAUZE/BANDAGES/DRESSINGS) ×2 IMPLANT
DURAPREP 26ML APPLICATOR (WOUND CARE) ×2 IMPLANT
ELECT REM PT RETURN 15FT ADLT (MISCELLANEOUS) ×2 IMPLANT
GAUZE XEROFORM 1X8 LF (GAUZE/BANDAGES/DRESSINGS) ×2 IMPLANT
GLOVE SRG 8 PF TXTR STRL LF DI (GLOVE) ×2 IMPLANT
GLOVE SURG ENC MOIS LTX SZ7.5 (GLOVE) ×2 IMPLANT
GLOVE SURG NEOPR MICRO LF SZ8 (GLOVE) ×2 IMPLANT
GLOVE SURG UNDER POLY LF SZ8 (GLOVE) ×4
GOWN STRL REUS W/TWL XL LVL3 (GOWN DISPOSABLE) ×4 IMPLANT
HANDPIECE INTERPULSE COAX TIP (DISPOSABLE) ×2
HEAD CERAMIC 36 PLUS 8.5 12 14 (Hips) ×1 IMPLANT
HOLDER FOLEY CATH W/STRAP (MISCELLANEOUS) ×2 IMPLANT
KIT TURNOVER KIT A (KITS) IMPLANT
PACK ANTERIOR HIP CUSTOM (KITS) ×2 IMPLANT
PINN SECTOR W/GRIP ACE CUP 56 (Hips) ×2 IMPLANT
PINNACLE ALTRX PLUS 4 N 36X56 (Hips) ×1 IMPLANT
SET HNDPC FAN SPRY TIP SCT (DISPOSABLE) ×1 IMPLANT
SPONGE T-LAP 18X18 ~~LOC~~+RFID (SPONGE) ×6 IMPLANT
STAPLER VISISTAT 35W (STAPLE) IMPLANT
STEM FEMORAL SZ5 HIGH ACTIS (Stem) ×1 IMPLANT
STRIP CLOSURE SKIN 1/2X4 (GAUZE/BANDAGES/DRESSINGS) IMPLANT
SUT ETHIBOND NAB CT1 #1 30IN (SUTURE) ×2 IMPLANT
SUT ETHILON 2 0 PS N (SUTURE) IMPLANT
SUT MNCRL AB 4-0 PS2 18 (SUTURE) IMPLANT
SUT VIC AB 0 CT1 36 (SUTURE) ×2 IMPLANT
SUT VIC AB 1 CT1 36 (SUTURE) ×2 IMPLANT
SUT VIC AB 2-0 CT1 27 (SUTURE) ×4
SUT VIC AB 2-0 CT1 TAPERPNT 27 (SUTURE) ×2 IMPLANT
TRAY FOLEY MTR SLVR 16FR STAT (SET/KITS/TRAYS/PACK) IMPLANT
YANKAUER SUCT BULB TIP NO VENT (SUCTIONS) ×2 IMPLANT

## 2021-10-21 NOTE — Interval H&P Note (Signed)
History and Physical Interval Note: The patient understands that he is here today for a left total hip replacement to treat his left hip end-stage avascular necrosis.  There has been no acute or interval change in his medical status.  Please see H&P.  The risks and benefits of surgery been explained in detail and informed consent is obtained.  The left operative hip has been marked.  10/21/2021 6:56 AM  Carlos Grant  has presented today for surgery, with the diagnosis of Left hip Avascular Necrosis.  The various methods of treatment have been discussed with the patient and family. After consideration of risks, benefits and other options for treatment, the patient has consented to  Procedure(s): Left TOTAL HIP ARTHROPLASTY ANTERIOR APPROACH (Left) as a surgical intervention.  The patient's history has been reviewed, patient examined, no change in status, stable for surgery.  I have reviewed the patient's chart and labs.  Questions were answered to the patient's satisfaction.     Kathryne Hitch

## 2021-10-21 NOTE — Transfer of Care (Signed)
Immediate Anesthesia Transfer of Care Note  Patient: Carlos Grant  Procedure(s) Performed: Left TOTAL HIP ARTHROPLASTY ANTERIOR APPROACH (Left: Hip)  Patient Location: PACU  Anesthesia Type:Spinal  Level of Consciousness: awake, alert  and oriented  Airway & Oxygen Therapy: Patient Spontanous Breathing and Patient connected to face mask oxygen  Post-op Assessment: Report given to RN and Post -op Vital signs reviewed and stable  Post vital signs: Reviewed and stable  Last Vitals:  Vitals Value Taken Time  BP 122/81 10/21/21 0852  Temp    Pulse 84 10/21/21 0857  Resp 19 10/21/21 0857  SpO2 100 % 10/21/21 0857  Vitals shown include unvalidated device data.  Last Pain:  Vitals:   10/21/21 0538  TempSrc: Oral  PainSc: 7       Patients Stated Pain Goal: 5 (10/21/21 0538)  Complications: No notable events documented.

## 2021-10-21 NOTE — Anesthesia Preprocedure Evaluation (Addendum)
Anesthesia Evaluation  Patient identified by MRN, date of birth, ID band Patient awake    Reviewed: Allergy & Precautions, NPO status , Patient's Chart, lab work & pertinent test results  History of Anesthesia Complications Negative for: history of anesthetic complications  Airway Mallampati: I  TM Distance: >3 FB Neck ROM: Full    Dental  (+) Partial Upper   Pulmonary neg pulmonary ROS, former smoker,    Pulmonary exam normal        Cardiovascular negative cardio ROS Normal cardiovascular exam     Neuro/Psych negative neurological ROS     GI/Hepatic negative GI ROS, Neg liver ROS,   Endo/Other  negative endocrine ROS  Renal/GU negative Renal ROS  negative genitourinary   Musculoskeletal  (+) Arthritis ,   Abdominal   Peds  Hematology negative hematology ROS (+)   Anesthesia Other Findings   Reproductive/Obstetrics                           Anesthesia Physical Anesthesia Plan  ASA: 1  Anesthesia Plan: Spinal   Post-op Pain Management: Tylenol PO (pre-op)* and Toradol IV (intra-op)*   Induction:   PONV Risk Score and Plan: 1 and Propofol infusion, Treatment may vary due to age or medical condition, Ondansetron, TIVA and Dexamethasone  Airway Management Planned: Nasal Cannula and Simple Face Mask  Additional Equipment: None  Intra-op Plan:   Post-operative Plan:   Informed Consent: I have reviewed the patients History and Physical, chart, labs and discussed the procedure including the risks, benefits and alternatives for the proposed anesthesia with the patient or authorized representative who has indicated his/her understanding and acceptance.       Plan Discussed with:   Anesthesia Plan Comments:         Anesthesia Quick Evaluation

## 2021-10-21 NOTE — Anesthesia Procedure Notes (Signed)
Spinal ° °Patient location during procedure: OR °Reason for block: surgical anesthesia °Staffing °Performed: anesthesiologist  °Anesthesiologist: Rever Pichette E, MD °Preanesthetic Checklist °Completed: patient identified, IV checked, risks and benefits discussed, surgical consent, monitors and equipment checked, pre-op evaluation and timeout performed °Spinal Block °Patient position: sitting °Prep: DuraPrep and site prepped and draped °Patient monitoring: continuous pulse ox, blood pressure and heart rate °Approach: midline °Location: L3-4 °Injection technique: single-shot °Needle °Needle type: Pencan  °Needle gauge: 24 G °Needle length: 10 cm °Assessment °Events: CSF return and second provider °Additional Notes °Functioning IV was confirmed and monitors were applied. Sterile prep and drape, including hand hygiene and sterile gloves were used. The patient was positioned and the spine was prepped. The skin was anesthetized with lidocaine.  Free flow of clear CSF was obtained prior to injecting local anesthetic into the CSF. The needle was carefully withdrawn. The patient tolerated the procedure well.  ° ° ° °

## 2021-10-21 NOTE — Evaluation (Signed)
Physical Therapy Evaluation Patient Details Name: Carlos Grant MRN: [id number] DOB: 1961-05-14 Today's Date: 10/21/2021  History of Present Illness  Pt s/p L THR  Clinical Impression  Pt s/p L THR and presents with decreased L LE strength/ROM and post op pain limiting functional mobility.  Pt should progress to dc home with family assist.     Recommendations for follow up therapy are one component of a multi-disciplinary discharge planning process, led by the attending physician.  Recommendations may be updated based on patient status, additional functional criteria and insurance authorization.  Follow Up Recommendations Follow physician's recommendations for discharge plan and follow up therapies    Assistance Recommended at Discharge    Patient can return home with the following  A little help with walking and/or transfers;A little help with bathing/dressing/bathroom;Assist for transportation;Help with stairs or ramp for entrance;Assistance with cooking/housework    Equipment Recommendations BSC/3in1 (pt states would like 3n1 but only if covered by insurance)  Recommendations for Other Services       Functional Status Assessment Patient has had a recent decline in their functional status and demonstrates the ability to make significant improvements in function in a reasonable and predictable amount of time.     Precautions / Restrictions Precautions Precautions: Fall Restrictions Weight Bearing Restrictions: No Other Position/Activity Restrictions: WBAT      Mobility  Bed Mobility Overal bed mobility: Needs Assistance Bed Mobility: Supine to Sit     Supine to sit: Min assist     General bed mobility comments: cues for sequence and use of R LE to self assist    Transfers Overall transfer level: Needs assistance Equipment used: Rolling walker (2 wheels) Transfers: Sit to/from Stand Sit to Stand: Min guard           General transfer comment: steady assist  with cues for LE management and use of UEs to self assist    Ambulation/Gait Ambulation/Gait assistance: Min assist, Min guard Gait Distance (Feet): 100 Feet Assistive device: Rolling walker (2 wheels) Gait Pattern/deviations: Step-to pattern, Step-through pattern, Decreased step length - right, Decreased step length - left, Shuffle       General Gait Details: cues for posture, position from RW, to decrease pace for safety and initial sequence  Stairs            Wheelchair Mobility    Modified Rankin (Stroke Patients Only)       Balance Overall balance assessment: Needs assistance Sitting-balance support: Feet supported, No upper extremity supported Sitting balance-Leahy Scale: Good     Standing balance support: No upper extremity supported Standing balance-Leahy Scale: Fair                               Pertinent Vitals/Pain Pain Assessment Pain Assessment: 0-10 Pain Score: 6  Pain Location: L hip Pain Descriptors / Indicators: Aching, Sore Pain Intervention(s): Limited activity within patient's tolerance, Monitored during session, Premedicated before session, Ice applied, Patient requesting pain meds-RN notified    Home Living Family/patient expects to be discharged to:: Private residence Living Arrangements: Spouse/significant other Available Help at Discharge: Family Type of Home: House Home Access: Stairs to enter Entrance Stairs-Rails: Left Entrance Stairs-Number of Steps: 4   Home Layout: One level Home Equipment: Agricultural consultant (2 wheels)      Prior Function Prior Level of Function : Independent/Modified Independent  Hand Dominance        Extremity/Trunk Assessment   Upper Extremity Assessment Upper Extremity Assessment: Overall WFL for tasks assessed    Lower Extremity Assessment Lower Extremity Assessment: LLE deficits/detail    Cervical / Trunk Assessment Cervical / Trunk Assessment: Normal   Communication   Communication: No difficulties  Cognition Arousal/Alertness: Awake/alert Behavior During Therapy: WFL for tasks assessed/performed Overall Cognitive Status: Within Functional Limits for tasks assessed                                          General Comments      Exercises     Assessment/Plan    PT Assessment Patient needs continued PT services  PT Problem List Decreased strength;Decreased range of motion;Decreased activity tolerance;Decreased balance;Decreased mobility;Decreased knowledge of use of DME;Pain       PT Treatment Interventions DME instruction;Gait training;Stair training;Functional mobility training;Therapeutic activities;Therapeutic exercise;Patient/family education    PT Goals (Current goals can be found in the Care Plan section)  Acute Rehab PT Goals Patient Stated Goal: REgain IND PT Goal Formulation: With patient Time For Goal Achievement: 10/28/21 Potential to Achieve Goals: Good    Frequency 7X/week     Co-evaluation               AM-PAC PT "6 Clicks" Mobility  Outcome Measure Help needed turning from your back to your side while in a flat bed without using bedrails?: A Little Help needed moving from lying on your back to sitting on the side of a flat bed without using bedrails?: A Little Help needed moving to and from a bed to a chair (including a wheelchair)?: A Little Help needed standing up from a chair using your arms (e.g., wheelchair or bedside chair)?: A Little Help needed to walk in hospital room?: A Little Help needed climbing 3-5 steps with a railing? : A Little 6 Click Score: 18    End of Session Equipment Utilized During Treatment: Gait belt Activity Tolerance: Patient tolerated treatment well Patient left: in chair;with chair alarm set;with call bell/phone within reach;with family/visitor present Nurse Communication: Mobility status PT Visit Diagnosis: Difficulty in walking, not elsewhere  classified (R26.2)    Time: 3536-1443 PT Time Calculation (min) (ACUTE ONLY): 20 min   Charges:   PT Evaluation $PT Eval Low Complexity: 1 Low          Mauro Kaufmann PT Acute Rehabilitation Services Pager [phone] Office [phone]   Safir Michalec 10/21/2021, 3:46 PM

## 2021-10-21 NOTE — Op Note (Addendum)
NAME: Carlos Grant, Carlos Grant MEDICAL RECORD NO: [id number] ACCOUNT NO: [id number] DATE OF BIRTH: [birth year] FACILITY: [facility] LOCATION: [facility] PHYSICIAN: Vanita Panda. Magnus Ivan, MD  Operative Report   DATE OF PROCEDURE: 10/21/2021  PREOPERATIVE DIAGNOSIS:  Avascular necrosis, left hip.  POSTOPERATIVE DIAGNOSIS:  Avascular necrosis, left hip.  PROCEDURE:  Left total hip arthroplasty through direct anterior approach.  IMPLANTS:  DePuy sector Gription acetabular component, size 56, size 36+4 polyethylene liner, size 5 ACTIS femoral component with high offset, size 36+8.5 ceramic hip ball.  SURGEON:  Vanita Panda. Magnus Ivan, MD  ASSISTANT:  Richardean Canal, PA-C.  ANESTHESIA:  Spinal.  ANTIBIOTICS:  2 g IV Ancef.  ESTIMATED BLOOD LOSS:  200 mL  COMPLICATIONS:  None.  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  His x-rays show complete loss of the joint space with flattening of the femoral head and femoral head collapse.  He has a  really hard time ambulating.  His hip is very stiff.  We have recommended a total hip arthroplasty based on his clinical exam and x-ray findings.  His left hip pain is daily and it is detrimentally affecting his mobility, his quality of life and his  activities of daily living.  We did talk in length and detail about the risk of acute blood loss anemia, nerve or vessel injury, fracture, infection, dislocation, DVT and implant failure, leg length differences and skin and soft tissue issues.  We talked  about our goals being decreased pain, improve mobility and overall improved quality of life.  DESCRIPTION OF PROCEDURE:  After informed consent was obtained, appropriate left hip was marked, he was brought to the operating room and sat up on the stretcher where spinal anesthesia was obtained.  He was laid in supine position on the stretcher.  A  Foley catheter was placed and traction  boots were placed on both his feet.  Next, he was placed supine on the Hana fracture table, the perineal post in place and both legs in line skeletal traction device and no traction applied.  His left operative hip  was prepped and draped with DuraPrep and sterile drapes.  A timeout was called and he was identified correct patient, correct left hip.  We then made an incision just inferior and posterior to the anterior superior iliac spine and carried this slightly  obliquely down the leg.  We dissected down tensor fascia lata muscle.  Tensor fascia was then divided longitudinally to proceed with direct anterior approach to the hip.  We identified and cauterized circumflex vessels and identified the hip capsule,  opened up the hip capsule in L-type format finding significant loose bodies and periarticular osteophytes around the lateral femoral head and neck and deformity of the femoral head as well.  We placed curved retractors in medial and lateral femoral neck  and made a femoral neck cut just barely proximal to the lesser trochanter and completed this on an osteotome.  We did this with an oscillating saw.  We then placed a corkscrew guide in the femoral head and removed the femoral head in its entirety.  It  was very large and devoid of cartilage and flattened.  We then placed a bent Hohmann over the medial acetabular rim and removed more loose bodies and debris from the acetabulum as well as remnants of the acetabular labrum.  We then began reaming under  direct visualization from a size 43 reamer eventually going all the way up  to a size 55 reamer with all reamers placed under direct visualization, the last reamer was placed under direct fluoroscopy, so I could obtain my depth of reaming by inclination  and anteversion.  I then placed real DePuy sector Gription acetabular component, size 56.  Due to the medialization of this and his offset we placed a 36+4 polyethylene liner.  Attention was then turned to  the femur.  With the leg externally rotated to  120 degrees and extended and abducted we were able to place a Mueller retractor medially and Hohman retractor behind the greater trochanter, we released lateral joint capsule and used a box cutting osteotome to enter femoral canal and a rongeur to  lateralize, we then began broaching using the ACTIS broaching system from a size 0 going up to a size 5.  With a size 5 in place, we trialed a high offset femoral neck and a 36+1.5 hip ball, reduced this in the acetabulum and we definitely needed more  leg length.  We dislocated the hip, removed the trial components.  We placed the real ACTIS femoral component with high offset size 5 and went with a 36+8.5 ceramic hip ball, again we reduced this in the acetabulum.  We were pleased with range of motion  and stability, assessed mechanically and radiographically as well as his offset and leg length.  We then irrigated the soft tissue with normal saline solution.  The joint capsule was closed with interrupted #1 Ethibond suture followed by #1 Vicryl to  close the tensor fascia.  0 Vicryl was used to close deep tissue and 2-0 Vicryl was used to close subcutaneous tissue.  The skin was closed with staples.  An Aquacel dressing was applied.  He was taken off the Hana table and taken to recovery room in  stable condition with all final counts being correct.  No complications noted.  Of note, Richardean Canal, PA-C did assist during the entire case and his assistance was crucial for facilitating all aspects of this case.  He was present from beginning to the end of the case and his assistance was medically necessary for retracting and management of soft tissues as well as assisting with component placement.  He was involved with a layer closure of the wound.   PUS D: 10/21/2021 8:33:31 am T: 10/21/2021 9:36:00 am  JOB: [id number]/ [id number]

## 2021-10-21 NOTE — Plan of Care (Signed)
  Problem: Clinical Measurements: Goal: Ability to maintain clinical measurements within normal limits will improve Outcome: Progressing   Problem: Activity: Goal: Risk for activity intolerance will decrease Outcome: Progressing   Problem: Pain Managment: Goal: General experience of comfort will improve Outcome: Progressing   Problem: Safety: Goal: Ability to remain free from injury will improve Outcome: Progressing   

## 2021-10-21 NOTE — Brief Op Note (Signed)
10/21/2021  8:34 AM  PATIENT:  Stanford Scotland  61 y.o. male  PRE-OPERATIVE DIAGNOSIS:  Left hip Avascular Necrosis  POST-OPERATIVE DIAGNOSIS:  Left hip Avascular Necrosis  PROCEDURE:  Procedure(s): Left TOTAL HIP ARTHROPLASTY ANTERIOR APPROACH (Left)  SURGEON:  Surgeon(s) and Role:    Kathryne Hitch, MD - Primary  PHYSICIAN ASSISTANT:  Rexene Edison, PA-C  ANESTHESIA:   spinal  EBL:  200 mL   COUNTS:  YES  DICTATION: .Other Dictation: Dictation Number [id number]  PLAN OF CARE: Admit for overnight observation  PATIENT DISPOSITION:  PACU - hemodynamically stable.   Delay start of Pharmacological VTE agent (>24hrs) due to surgical blood loss or risk of bleeding: no

## 2021-10-21 NOTE — Anesthesia Postprocedure Evaluation (Signed)
Anesthesia Post Note  Patient: Carlos Grant  Procedure(s) Performed: Left TOTAL HIP ARTHROPLASTY ANTERIOR APPROACH (Left: Hip)     Patient location during evaluation: PACU Anesthesia Type: Spinal Level of consciousness: oriented and awake and alert Pain management: pain level controlled Vital Signs Assessment: post-procedure vital signs reviewed and stable Respiratory status: spontaneous breathing, respiratory function stable and nonlabored ventilation Cardiovascular status: blood pressure returned to baseline and stable Postop Assessment: no headache, no backache, no apparent nausea or vomiting and spinal receding Anesthetic complications: no   No notable events documented.  Last Vitals:  Vitals:   10/21/21 1015 10/21/21 1030  BP: 119/86 129/89  Pulse: 67 80  Resp: 15 10  Temp:    SpO2: 100% 100%    Last Pain:  Vitals:   10/21/21 1030  TempSrc:   PainSc: 0-No pain                 Lucretia Kern

## 2021-10-22 DIAGNOSIS — M87052 Idiopathic aseptic necrosis of left femur: Secondary | ICD-10-CM | POA: Diagnosis not present

## 2021-10-22 LAB — BASIC METABOLIC PANEL
Anion gap: 6 (ref 5–15)
BUN: 10 mg/dL (ref 6–20)
CO2: 23 mmol/L (ref 22–32)
Calcium: 8.1 mg/dL — ABNORMAL LOW (ref 8.9–10.3)
Chloride: 103 mmol/L (ref 98–111)
Creatinine, Ser: 0.84 mg/dL (ref 0.61–1.24)
GFR, Estimated: >60 mL/min (ref >60)
Glucose, Bld: 174 mg/dL — ABNORMAL HIGH (ref 70–99)
Potassium: 3.4 mmol/L — ABNORMAL LOW (ref 3.5–5.1)
Sodium: 132 mmol/L — ABNORMAL LOW (ref 135–145)

## 2021-10-22 LAB — CBC
HCT: 32.9 % — ABNORMAL LOW (ref 39.0–52.0)
Hemoglobin: 10.8 g/dL — ABNORMAL LOW (ref 13.0–17.0)
MCH: 31.3 pg (ref 26.0–34.0)
MCHC: 32.8 g/dL (ref 30.0–36.0)
MCV: 95.4 fL (ref 80.0–100.0)
Platelets: 198 10*3/uL (ref 150–400)
RBC: 3.45 MIL/uL — ABNORMAL LOW (ref 4.22–5.81)
RDW: 12.9 % (ref 11.5–15.5)
WBC: 9.2 10*3/uL (ref 4.0–10.5)
nRBC: 0 % (ref 0.0–0.2)

## 2021-10-22 MED ORDER — HYDROCODONE-ACETAMINOPHEN 7.5-325 MG PO TABS
1.0000 | ORAL_TABLET | Freq: Four times a day (QID) | ORAL | Status: DC | PRN
  Administered 2021-10-22: 1 via ORAL
  Filled 2021-10-22: qty 1

## 2021-10-22 MED ORDER — HYDROCODONE-ACETAMINOPHEN 7.5-325 MG PO TABS: 1.0000 | ORAL_TABLET | Freq: Four times a day (QID) | ORAL | 0 refills | Status: DC | PRN

## 2021-10-22 MED ORDER — ASPIRIN 81 MG PO CHEW: 81.0000 mg | CHEWABLE_TABLET | Freq: Two times a day (BID) | ORAL | 0 refills | Status: DC

## 2021-10-22 MED ORDER — METHOCARBAMOL 500 MG PO TABS: 500.0000 mg | ORAL_TABLET | Freq: Four times a day (QID) | ORAL | 1 refills | Status: DC | PRN

## 2021-10-22 NOTE — Progress Notes (Signed)
Physical Therapy Treatment Patient Details Name: Carlos Grant MRN: [id number] DOB: 1961-03-12 Today's Date: 10/22/2021   History of Present Illness Pt s/p L THR    PT Comments    Pt motivated and progressing well with mobility.  Pt up to ambulate increased distance in hall, negotiated stairs and HEP initiated.  Pt hopeful for dc home this pm.   Recommendations for follow up therapy are one component of a multi-disciplinary discharge planning process, led by the attending physician.  Recommendations may be updated based on patient status, additional functional criteria and insurance authorization.  Follow Up Recommendations  Follow physician's recommendations for discharge plan and follow up therapies     Assistance Recommended at Discharge    Patient can return home with the following A little help with walking and/or transfers;A little help with bathing/dressing/bathroom;Assist for transportation;Help with stairs or ramp for entrance;Assistance with cooking/housework   Equipment Recommendations  BSC/3in1    Recommendations for Other Services       Precautions / Restrictions Precautions Precautions: Fall Restrictions Weight Bearing Restrictions: No Other Position/Activity Restrictions: WBAT     Mobility  Bed Mobility Overal bed mobility: Needs Assistance Bed Mobility: Supine to Sit     Supine to sit: Min guard     General bed mobility comments: cues for sequence and use of R LE to self assist; pt using gait belt to assist L LE    Transfers Overall transfer level: Needs assistance Equipment used: Rolling walker (2 wheels) Transfers: Sit to/from Stand Sit to Stand: Min guard, Supervision           General transfer comment: cues for LE management and use of UEs to self assist    Ambulation/Gait Ambulation/Gait assistance: Min guard, Supervision Gait Distance (Feet): 150 Feet Assistive device: Rolling walker (2 wheels) Gait Pattern/deviations: Step-to  pattern, Step-through pattern, Decreased step length - right, Decreased step length - left, Shuffle, Trunk flexed       General Gait Details: cues for posture, position from RW, to decrease pace for safety and initial sequence   Stairs Stairs: Yes Stairs assistance: Min guard Stair Management: One rail Left, Step to pattern, Forwards, With cane Number of Stairs: 5 General stair comments: cues for sequence   Wheelchair Mobility    Modified Rankin (Stroke Patients Only)       Balance Overall balance assessment: Needs assistance Sitting-balance support: Feet supported, No upper extremity supported Sitting balance-Leahy Scale: Good     Standing balance support: No upper extremity supported Standing balance-Leahy Scale: Fair                              Cognition Arousal/Alertness: Awake/alert Behavior During Therapy: WFL for tasks assessed/performed Overall Cognitive Status: Within Functional Limits for tasks assessed                                          Exercises Total Joint Exercises Ankle Circles/Pumps: AROM, Both, 20 reps, Supine Quad Sets: AROM, Both, 10 reps, Supine Heel Slides: AAROM, Left, 20 reps, Supine Hip ABduction/ADduction: AAROM, Left, 15 reps, Supine Long Arc Quad: AAROM, AROM, Left, 10 reps, Seated    General Comments        Pertinent Vitals/Pain Pain Assessment Pain Assessment: 0-10 Pain Score: 5  Pain Location: L hip Pain Descriptors / Indicators: Aching, Sore Pain Intervention(s): Limited  activity within patient's tolerance, Monitored during session, Premedicated before session, Ice applied    Home Living                          Prior Function            PT Goals (current goals can now be found in the care plan section) Acute Rehab PT Goals Patient Stated Goal: REgain IND PT Goal Formulation: With patient Time For Goal Achievement: 10/28/21 Potential to Achieve Goals: Good Progress  towards PT goals: Progressing toward goals    Frequency    7X/week      PT Plan Current plan remains appropriate    Co-evaluation              AM-PAC PT "6 Clicks" Mobility   Outcome Measure  Help needed turning from your back to your side while in a flat bed without using bedrails?: A Little Help needed moving from lying on your back to sitting on the side of a flat bed without using bedrails?: A Little Help needed moving to and from a bed to a chair (including a wheelchair)?: A Little Help needed standing up from a chair using your arms (e.g., wheelchair or bedside chair)?: A Little Help needed to walk in hospital room?: A Little Help needed climbing 3-5 steps with a railing? : A Little 6 Click Score: 18    End of Session Equipment Utilized During Treatment: Gait belt Activity Tolerance: Patient tolerated treatment well Patient left: in chair;with chair alarm set;with call bell/phone within reach;with family/visitor present Nurse Communication: Mobility status PT Visit Diagnosis: Difficulty in walking, not elsewhere classified (R26.2)     Time: 9480-1655 PT Time Calculation (min) (ACUTE ONLY): 30 min  Charges:  $Gait Training: 8-22 mins $Therapeutic Exercise: 8-22 mins                     Carlos Grant PT Acute Rehabilitation Services Pager [phone] Office [phone]    Carlos Grant 10/22/2021, 12:09 PM

## 2021-10-22 NOTE — Plan of Care (Signed)
  Problem: Pain Managment: Goal: General experience of comfort will improve Outcome: Progressing   Problem: Safety: Goal: Ability to remain free from injury will improve Outcome: Progressing   

## 2021-10-22 NOTE — Plan of Care (Signed)

## 2021-10-22 NOTE — Discharge Instructions (Signed)

## 2021-10-22 NOTE — Progress Notes (Signed)
Physical Therapy Treatment Patient Details Name: Carlos Grant MRN: [id number] DOB: Aug 18, 1961 Today's Date: 10/22/2021   History of Present Illness Pt s/p L THR    PT Comments    Pt performed HEP, with written instruction provided including progression.  Pt reviewed car transfers.  Pt and significant other with questions asked and answered.  Pt eager for dc home this date.   Recommendations for follow up therapy are one component of a multi-disciplinary discharge planning process, led by the attending physician.  Recommendations may be updated based on patient status, additional functional criteria and insurance authorization.  Follow Up Recommendations  Follow physician's recommendations for discharge plan and follow up therapies     Assistance Recommended at Discharge    Patient can return home with the following A little help with walking and/or transfers;A little help with bathing/dressing/bathroom;Assist for transportation;Help with stairs or ramp for entrance;Assistance with cooking/housework   Equipment Recommendations  BSC/3in1    Recommendations for Other Services       Precautions / Restrictions Precautions Precautions: Fall Restrictions Weight Bearing Restrictions: No Other Position/Activity Restrictions: WBAT     Mobility  Bed Mobility Overal bed mobility: Needs Assistance Bed Mobility: Supine to Sit     Supine to sit: Min guard     General bed mobility comments: OOB deferred    Transfers Overall transfer level: Needs assistance Equipment used: Rolling walker (2 wheels) Transfers: Sit to/from Stand Sit to Stand: Min guard, Supervision           General transfer comment: cues for LE management and use of UEs to self assist    Ambulation/Gait Ambulation/Gait assistance: Min guard, Supervision Gait Distance (Feet): 150 Feet Assistive device: Rolling walker (2 wheels) Gait Pattern/deviations: Step-to pattern, Step-through pattern, Decreased  step length - right, Decreased step length - left, Shuffle, Trunk flexed       General Gait Details: cues for posture, position from RW, to decrease pace for safety and initial sequence   Stairs Stairs: Yes Stairs assistance: Min guard Stair Management: One rail Left, Step to pattern, Forwards, With cane Number of Stairs: 5 General stair comments: cues for sequence   Wheelchair Mobility    Modified Rankin (Stroke Patients Only)       Balance Overall balance assessment: Needs assistance Sitting-balance support: Feet supported, No upper extremity supported Sitting balance-Leahy Scale: Good     Standing balance support: No upper extremity supported Standing balance-Leahy Scale: Fair                              Cognition Arousal/Alertness: Awake/alert Behavior During Therapy: WFL for tasks assessed/performed Overall Cognitive Status: Within Functional Limits for tasks assessed                                          Exercises Total Joint Exercises Ankle Circles/Pumps: AROM, Both, 20 reps, Supine Quad Sets: AROM, Both, 10 reps, Supine Heel Slides: AAROM, Left, Supine, 10 reps Hip ABduction/ADduction: AAROM, Left, Supine, 10 reps Long Arc Quad: AAROM, AROM, Left, 10 reps, Seated    General Comments        Pertinent Vitals/Pain Pain Assessment Pain Assessment: 0-10 Pain Score: 4  Pain Location: L hip Pain Descriptors / Indicators: Aching, Sore Pain Intervention(s): Limited activity within patient's tolerance    Home Living  Prior Function            PT Goals (current goals can now be found in the care plan section) Acute Rehab PT Goals Patient Stated Goal: REgain IND PT Goal Formulation: With patient Time For Goal Achievement: 10/28/21 Potential to Achieve Goals: Good Progress towards PT goals: Progressing toward goals    Frequency    7X/week      PT Plan Current plan remains  appropriate    Co-evaluation              AM-PAC PT "6 Clicks" Mobility   Outcome Measure  Help needed turning from your back to your side while in a flat bed without using bedrails?: A Little Help needed moving from lying on your back to sitting on the side of a flat bed without using bedrails?: A Little Help needed moving to and from a bed to a chair (including a wheelchair)?: A Little Help needed standing up from a chair using your arms (e.g., wheelchair or bedside chair)?: A Little Help needed to walk in hospital room?: A Little Help needed climbing 3-5 steps with a railing? : A Little 6 Click Score: 18    End of Session Equipment Utilized During Treatment: Gait belt Activity Tolerance: Patient tolerated treatment well Patient left: in bed;with call bell/phone within reach;with family/visitor present Nurse Communication: Mobility status PT Visit Diagnosis: Difficulty in walking, not elsewhere classified (R26.2)     Time: 1110-1135 PT Time Calculation (min) (ACUTE ONLY): 25 min  Charges:  $Gait Training: 8-22 mins $Therapeutic Exercise: 8-22 mins $Therapeutic Activity: 8-22 mins                     Debe Coder PT Acute Rehabilitation Services Pager [phone] Office [phone]    Nikita Surman 10/22/2021, 12:13 PM

## 2021-10-22 NOTE — Progress Notes (Signed)
Subjective: 1 Day Post-Op Procedure(s) (LRB): Left TOTAL HIP ARTHROPLASTY ANTERIOR APPROACH (Left) Patient reports pain as moderate.    Objective: Vital signs in last 24 hours: Temp:  [97.4 F (36.3 C)-98.4 F (36.9 C)] 98 F (36.7 C) (02/25 0540) Pulse Rate:  [66-96] 82 (02/25 0540) Resp:  [10-18] 18 (02/25 0540) BP: (109-135)/(81-99) 109/82 (02/25 0540) SpO2:  [96 %-100 %] 100 % (02/25 0540)  Intake/Output from previous day: 02/24 0701 - 02/25 0700 In: 3404.8 [P.O.:840; I.V.:2314.8; IV Piggyback:250] Out: 1350 [Urine:1150; Blood:200] Intake/Output this shift: No intake/output data recorded.  Recent Labs    10/22/21 0302  HGB 10.8*   Recent Labs    10/22/21 0302  WBC 9.2  RBC 3.45*  HCT 32.9*  PLT 198   Recent Labs    10/22/21 0302  NA 132*  K 3.4*  CL 103  CO2 23  BUN 10  CREATININE 0.84  GLUCOSE 174*  CALCIUM 8.1*   No results for input(s): LABPT, INR in the last 72 hours.  Sensation intact distally Intact pulses distally Dorsiflexion/Plantar flexion intact Incision: dressing C/D/I   Assessment/Plan: 1 Day Post-Op Procedure(s) (LRB): Left TOTAL HIP ARTHROPLASTY ANTERIOR APPROACH (Left) Up with therapy Discharge to home today.     Carlos Grant 10/22/2021, 9:52 AM

## 2021-10-22 NOTE — TOC Transition Note (Addendum)
Transition of Care [facility]) - CM/SW Discharge Note   Patient Details  Name: Carlos Grant MRN: [id number] Date of Birth: 1960/11/15  [facility] of [facility] CM/SW Contact:  Darleene Cleaver, LCSW Phone Number: 10/22/2021, 5:55 PM   Clinical Narrative:    CSW discussed with patient if he needed any equipment, and patient stated he needed a 3 in 1.  CSW spoke to [place] at [facility], they are in network with his insurance and are able to deliver 3 in 1 to room before he leaves.  CSW signing off, patient discharging home, and will be having home health through [facility] which was prearranged.         Patient Goals and CMS Choice        Discharge Placement                       Discharge Plan and Services                                     Social Determinants of Health (SDOH) Interventions     Readmission Risk Interventions No flowsheet data found.

## 2021-10-22 NOTE — Progress Notes (Signed)
Discharge package printed and instructions given to pt. Pt verbalizes understanding. 

## 2021-10-22 NOTE — Discharge Summary (Signed)
Patient ID: Carlos Grant MRN: [id number] DOB/AGE: [birth year] 61 y.o.  Admit date: 10/21/2021 Discharge date: 10/22/2021  Admission Diagnoses:  Principal Problem:   Avascular necrosis of hip, left ([place]) Active Problems:   Status post left hip replacement   Discharge Diagnoses:  Same  Past Medical History:  Diagnosis Date   Arthritis    Family history of adverse reaction to anesthesia    PONV (postoperative nausea and vomiting)     Surgeries: Procedure(s): Left TOTAL HIP ARTHROPLASTY ANTERIOR APPROACH on 10/21/2021   Consultants:   Discharged Condition: Improved  Hospital Course: Carlos Grant is an 61 y.o. male who was admitted 10/21/2021 for operative treatment ofAvascular necrosis of hip, left ([place]). Patient has severe unremitting pain that affects sleep, daily activities, and work/hobbies. After pre-op clearance the patient was taken to the operating room on 10/21/2021 and underwent  Procedure(s): Left [place].    Patient was given perioperative antibiotics:  Anti-infectives (From admission, onward)    Start     Dose/Rate Route Frequency Ordered Stop   10/21/21 1400  ceFAZolin (ANCEF) IVPB 1 g/50 mL premix        1 g 100 mL/hr over 30 Minutes Intravenous Every 6 hours 10/21/21 1053 10/21/21 2027   10/21/21 0600  ceFAZolin (ANCEF) IVPB 2g/100 mL premix        2 g 200 mL/hr over 30 Minutes Intravenous On call to O.R. 10/21/21 [id number] 10/21/21 [id number]        Patient was given sequential compression devices, early ambulation, and chemoprophylaxis to prevent DVT.  Patient benefited maximally from hospital stay and there were no complications.    Recent vital signs: Patient Vitals for the past 24 hrs:  BP Temp Temp src Pulse Resp SpO2  10/22/21 0540 109/82 98 F (36.7 C) Oral 82 18 100 %  10/22/21 0157 112/89 98.2 F (36.8 C) Oral 90 18 98 %  10/21/21 2105 121/82 98.4 F (36.9 C) Oral 81 18 98 %  10/21/21 1806 (!) 135/91 -- --  96 16 96 %  10/21/21 1247 116/81 98.4 F (36.9 C) Oral 77 16 98 %  10/21/21 1058 (!) 132/99 97.7 F (36.5 C) Oral 66 16 98 %  10/21/21 1045 126/89 (!) 97.4 F (36.3 C) -- 75 10 100 %  10/21/21 1030 129/89 -- -- 80 10 100 %  10/21/21 1015 119/86 -- -- 67 15 100 %  10/21/21 1000 111/85 -- -- 66 11 100 %     Recent laboratory studies:  Recent Labs    10/22/21 0302  WBC 9.2  HGB 10.8*  HCT 32.9*  PLT 198  NA 132*  K 3.4*  CL 103  CO2 23  BUN 10  CREATININE 0.84  GLUCOSE 174*  CALCIUM 8.1*     Discharge Medications:   Allergies as of 10/22/2021   No Known Allergies      Medication List     STOP taking these medications    HYDROcodone-acetaminophen 5-325 MG tablet Commonly known as: NORCO/VICODIN Replaced by: HYDROcodone-acetaminophen 7.5-325 MG tablet       TAKE these medications    aspirin 81 MG chewable tablet Chew 1 tablet (81 mg total) by mouth 2 (two) times daily.   HYDROcodone-acetaminophen 7.5-325 MG tablet Commonly known as: NORCO Take 1-2 tablets by mouth every 6 (six) hours as needed for moderate pain. Replaces: HYDROcodone-acetaminophen 5-325 MG tablet   ibuprofen 200 MG tablet Commonly known as: ADVIL Take 800 mg by mouth 2 (two)  times daily.   methocarbamol 500 MG tablet Commonly known as: ROBAXIN Take 1 tablet (500 mg total) by mouth every 6 (six) hours as needed for muscle spasms.               Durable Medical Equipment  (From admission, onward)           Start     Ordered   10/21/21 1054  DME 3 n 1  Once        10/21/21 1053   10/21/21 1054  DME Walker rolling  Once       Question Answer Comment  Walker: With 5 Inch Wheels   Patient needs a walker to treat with the following condition Status post total replacement of left hip      10/21/21 1053            Diagnostic Studies: DG Pelvis Portable  Result Date: 10/21/2021 CLINICAL DATA:  Status post left hip arthroplasty EXAM: PORTABLE PELVIS 1-2 VIEWS  COMPARISON:  Intraoperative radiographs obtained earlier the same day, preoperative radiographs 08/30/2021 FINDINGS: Postsurgical changes reflecting left hip arthroplasty are seen. Hardware alignment is within expected limits, without evidence of complication. There is expected surrounding postoperative soft tissue gas. There is degenerative change in the lower lumbar spine. IMPRESSION: Post left hip replacement without evidence of immediate complication. Electronically Signed   By: Valetta Mole M.D.   On: 10/21/2021 10:04   DG C-Arm 1-60 Min-No Report  Result Date: 10/21/2021 Fluoroscopy was utilized by the requesting physician.  No radiographic interpretation.   DG HIP UNILAT WITH PELVIS 1V LEFT  Result Date: 10/21/2021 CLINICAL DATA:  Left hip replacement EXAM: DG HIP (WITH OR WITHOUT PELVIS) 1V*L* COMPARISON:  Hip radiographs 08/30/2021 FINDINGS: Six C-arm fluoroscopic images were obtained intraoperatively and submitted for post operative interpretation. Initial images demonstrate marked degenerative change about the left hip, unchanged. Subsequent images demonstrate postsurgical changes reflecting left hip arthroplasty. Hardware alignment is within expected limits, without evidence of complication. Please see the performing provider's procedural report for further detail. IMPRESSION: Status post left hip replacement without evidence of immediate complication. Electronically Signed   By: Valetta Mole M.D.   On: 10/21/2021 08:47    Disposition: Discharge disposition: 01-Home or Self Care       Discharge Instructions     Discharge patient   Complete by: As directed    Discharge disposition: 01-Home or Self Care   Discharge patient date: 10/22/2021        Follow-up Information     Mcarthur Rossetti, MD Follow up in 2 week(s).   Specialty: Orthopedic Surgery Contact information: [address] [place] [address] [phone]                  Signed: Mcarthur Rossetti 10/22/2021, 9:55 AM

## 2021-10-24 ENCOUNTER — Encounter (HOSPITAL_COMMUNITY): Payer: Self-pay | Admitting: Orthopaedic Surgery

## 2021-10-31 ENCOUNTER — Telehealth: Payer: Self-pay | Admitting: Orthopaedic Surgery

## 2021-10-31 ENCOUNTER — Telehealth: Payer: Self-pay

## 2021-10-31 ENCOUNTER — Other Ambulatory Visit: Payer: Self-pay | Admitting: Orthopaedic Surgery

## 2021-10-31 MED ORDER — HYDROCODONE-ACETAMINOPHEN 7.5-325 MG PO TABS: 1.0000 | ORAL_TABLET | Freq: Four times a day (QID) | ORAL | 0 refills | Status: DC | PRN

## 2021-10-31 NOTE — Telephone Encounter (Signed)
Please advise 

## 2021-10-31 NOTE — Telephone Encounter (Signed)
Patient called and would like a refill on his hydrocodone patient said the script was only written for 7 days due to his insurance.  ?Please advise  ?

## 2021-10-31 NOTE — Telephone Encounter (Signed)
Pt called stating his pharmacy is out of the mg of hydrocodone and asking for the prescription to be sent to [facility] on [address] [place]. Please call pt about this matter at [phone]. ?

## 2021-11-03 ENCOUNTER — Encounter: Payer: Self-pay | Admitting: Orthopaedic Surgery

## 2021-11-03 ENCOUNTER — Ambulatory Visit (INDEPENDENT_AMBULATORY_CARE_PROVIDER_SITE_OTHER): Payer: 59 | Admitting: Orthopaedic Surgery

## 2021-11-03 ENCOUNTER — Other Ambulatory Visit: Payer: Self-pay

## 2021-11-03 DIAGNOSIS — Z96642 Presence of left artificial hip joint: Secondary | ICD-10-CM

## 2021-11-03 NOTE — Progress Notes (Signed)
The patient be 2 weeks tomorrow status post a left total hip arthroplasty.  He is 61 years old.  He said he is doing well overall and has no issues.  He has been exercising quite a bit and feels like he may have overdone it a few nights ago.  He is ambulating with a cane.  He has been compliant with taking a baby aspirin twice a day. ? ?His left hip incision looks good.  I remove the staples and place Steri-Strips.  He does have a moderate hematoma but not a seroma.  I did try to aspirate fluid from the hip area but got minimal fluid. ? ?He will stop his aspirin and can stop significant exercising.  He can alternate ice and heat over the incision this will help.  We will see him back in 4 weeks to see how he is doing overall but no x-rays are needed. ?

## 2021-11-08 ENCOUNTER — Telehealth: Payer: Self-pay | Admitting: Orthopaedic Surgery

## 2021-11-08 ENCOUNTER — Other Ambulatory Visit: Payer: Self-pay | Admitting: Physician Assistant

## 2021-11-08 MED ORDER — HYDROCODONE-ACETAMINOPHEN 7.5-325 MG PO TABS: 1.0000 | ORAL_TABLET | Freq: Four times a day (QID) | ORAL | 0 refills | Status: DC | PRN

## 2021-11-08 NOTE — Telephone Encounter (Signed)
Patient called. He would like a refill on hydrocodone. His call back number is [phone] ?

## 2021-11-16 ENCOUNTER — Other Ambulatory Visit: Payer: Self-pay | Admitting: Orthopaedic Surgery

## 2021-11-16 ENCOUNTER — Telehealth: Payer: Self-pay | Admitting: Physician Assistant

## 2021-11-16 MED ORDER — HYDROCODONE-ACETAMINOPHEN 7.5-325 MG PO TABS: 1.0000 | ORAL_TABLET | Freq: Four times a day (QID) | ORAL | 0 refills | Status: DC | PRN

## 2021-11-16 MED ORDER — METHOCARBAMOL 500 MG PO TABS: 500.0000 mg | ORAL_TABLET | Freq: Four times a day (QID) | ORAL | 1 refills | Status: DC | PRN

## 2021-11-16 NOTE — Telephone Encounter (Signed)
Pt called requesting a refill of pain meds. Please send to pharmacy on file. Phone number is [phone] ?

## 2021-11-16 NOTE — Telephone Encounter (Signed)
Pt called again about refill.  °

## 2021-11-24 ENCOUNTER — Telehealth: Payer: Self-pay | Admitting: Orthopaedic Surgery

## 2021-11-24 ENCOUNTER — Other Ambulatory Visit: Payer: Self-pay | Admitting: Orthopaedic Surgery

## 2021-11-24 MED ORDER — HYDROCODONE-ACETAMINOPHEN 7.5-325 MG PO TABS: 1.0000 | ORAL_TABLET | Freq: Four times a day (QID) | ORAL | 0 refills | Status: DC | PRN

## 2021-11-24 NOTE — Telephone Encounter (Signed)
Please call in Hydrocodone  [phone] ?

## 2021-12-01 ENCOUNTER — Telehealth: Payer: Self-pay | Admitting: Physician Assistant

## 2021-12-01 ENCOUNTER — Other Ambulatory Visit: Payer: Self-pay | Admitting: Orthopaedic Surgery

## 2021-12-01 MED ORDER — HYDROCODONE-ACETAMINOPHEN 7.5-325 MG PO TABS: 1.0000 | ORAL_TABLET | Freq: Four times a day (QID) | ORAL | 0 refills | Status: DC | PRN

## 2021-12-01 NOTE — Telephone Encounter (Signed)
Pt called requesting a refill of hydrocodone. Please send to pharmacy on file. Phone number is [phone]. ?

## 2021-12-01 NOTE — Telephone Encounter (Signed)
Please advise 

## 2021-12-05 ENCOUNTER — Encounter: Payer: 59 | Admitting: Orthopaedic Surgery

## 2021-12-08 ENCOUNTER — Telehealth: Payer: Self-pay | Admitting: Orthopaedic Surgery

## 2021-12-08 ENCOUNTER — Other Ambulatory Visit: Payer: Self-pay | Admitting: Orthopaedic Surgery

## 2021-12-08 MED ORDER — HYDROCODONE-ACETAMINOPHEN 7.5-325 MG PO TABS: 1.0000 | ORAL_TABLET | Freq: Three times a day (TID) | ORAL | 0 refills | Status: DC | PRN

## 2021-12-08 NOTE — Telephone Encounter (Signed)
Patient called needing Rx refilled Hydrocodone. The number to contact patient is [phone] ?

## 2021-12-14 ENCOUNTER — Encounter: Payer: Self-pay | Admitting: Orthopaedic Surgery

## 2021-12-14 ENCOUNTER — Ambulatory Visit (INDEPENDENT_AMBULATORY_CARE_PROVIDER_SITE_OTHER): Payer: 59 | Admitting: Orthopaedic Surgery

## 2021-12-14 ENCOUNTER — Telehealth: Payer: Self-pay | Admitting: Orthopaedic Surgery

## 2021-12-14 DIAGNOSIS — Z96642 Presence of left artificial hip joint: Secondary | ICD-10-CM

## 2021-12-14 DIAGNOSIS — M25551 Pain in right hip: Secondary | ICD-10-CM

## 2021-12-14 NOTE — Progress Notes (Signed)
The patient is now around 2 months status post a left total hip arthroplasty.  His left hip is doing well overall and his function of that hip is improving.  However he is still required significant pain medication and he said he is now developed severe debilitating pain with his right hip. ? ?His left operative hip moves smoothly and fluidly.  He has severe guarding and pain with attempts of rotation of the right hip. ? ?At this point given the severity of his right hip pain and given the fact that he had such severe avascular necrosis of his left hip, a MRI of the right hip is warranted to evaluate the cartilage and to assess for AVN.  We will see him back once we have the MRI of his right hip. ?

## 2021-12-14 NOTE — Telephone Encounter (Signed)
Patient called. Says the MRI needs to be an open MRI. FYI for Dr. Magnus Ivan ?

## 2021-12-14 NOTE — Telephone Encounter (Signed)
noted 

## 2021-12-15 ENCOUNTER — Other Ambulatory Visit: Payer: Self-pay

## 2021-12-15 DIAGNOSIS — M25551 Pain in right hip: Secondary | ICD-10-CM

## 2021-12-20 ENCOUNTER — Telehealth: Payer: Self-pay | Admitting: Orthopaedic Surgery

## 2021-12-20 ENCOUNTER — Telehealth: Payer: Self-pay | Admitting: *Deleted

## 2021-12-20 ENCOUNTER — Other Ambulatory Visit: Payer: Self-pay | Admitting: Orthopaedic Surgery

## 2021-12-20 MED ORDER — DIAZEPAM 5 MG PO TABS: 5.0000 mg | ORAL_TABLET | Freq: Once | ORAL | 0 refills | Status: AC

## 2021-12-20 NOTE — Telephone Encounter (Signed)
Tried calling pt back vm has not been set up, Pt has [employer] health plan he can only [facility] ?

## 2021-12-20 NOTE — Telephone Encounter (Signed)
Please advise 

## 2021-12-20 NOTE — Telephone Encounter (Signed)
Pt has MRI appt next week and says he is terribly claustrophobic and would to have something to help ease him, he has the open MRI unit, but would like to know he has something on hand.  ?

## 2021-12-20 NOTE — Telephone Encounter (Signed)
Pt called and was wondering if he could get an open MRI scheduled. Can you please call pt? ? ?Cb [phone]  ?

## 2021-12-21 NOTE — Telephone Encounter (Signed)
Pt was called and informed and stated understanding  

## 2021-12-28 ENCOUNTER — Ambulatory Visit (HOSPITAL_BASED_OUTPATIENT_CLINIC_OR_DEPARTMENT_OTHER)
Admission: RE | Admit: 2021-12-28 | Discharge: 2021-12-28 | Disposition: A | Discharge status: Home / Self Care | Payer: 59 | Source: Ambulatory Visit | Attending: Orthopaedic Surgery | Admitting: Orthopaedic Surgery

## 2021-12-28 DIAGNOSIS — M25551 Pain in right hip: Secondary | ICD-10-CM | POA: Diagnosis present

## 2021-12-28 IMAGING — MR MR HIP*R* W/O CM
5 series · 31 of 40 positions shown · non-contrast
Comparison: X-ray [DATE]

CLINICAL DATA: Hip pain, chronic; assess cartilage

EXAM:
MR OF THE RIGHT HIP WITHOUT CONTRAST
TECHNIQUE: Multiplanar, multisequence MR imaging was performed. No intravenous
contrast was administered.

[Series 3: T1 · coronal · 4.0mm · 0.99mm/px · 2 of 37 slices shown]
[im 1/37]
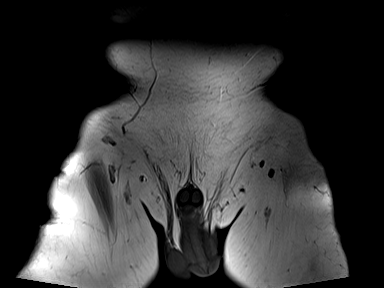
[im 5/37]
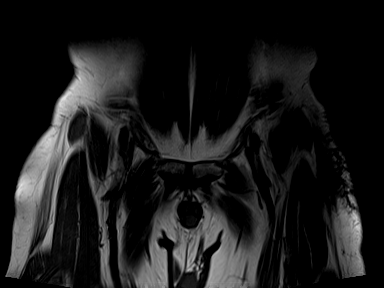

[Series 4: T2 fat-sat · coronal · 4.0mm · 0.99mm/px · 9 of 37 slices shown (1 of 2)]
[im 1/37]
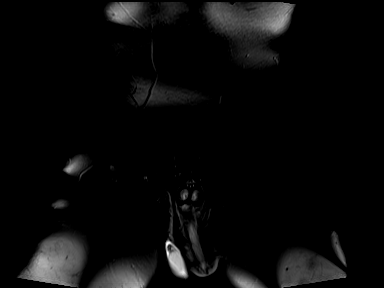
[im 5/37]
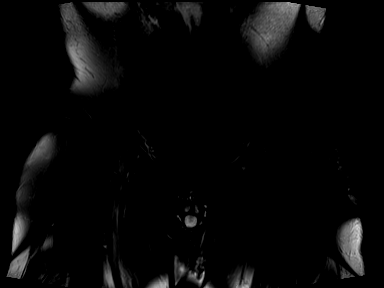
[im 10/37]
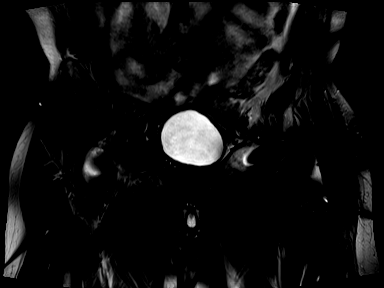
[im 14/37]
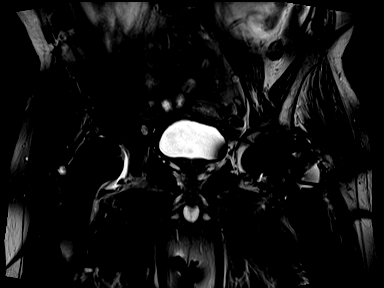
[im 19/37]
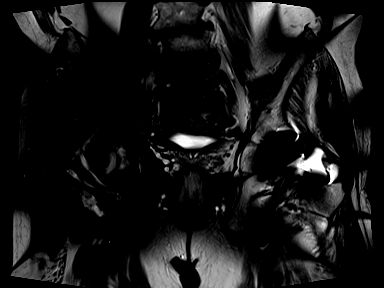
[im 23/37]
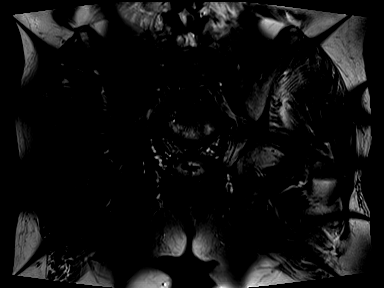
[im 28/37]
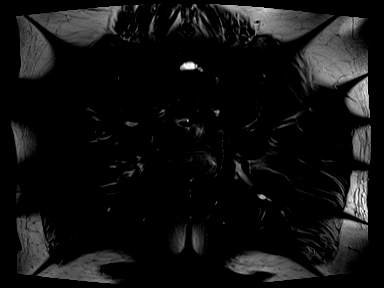
[im 32/37]
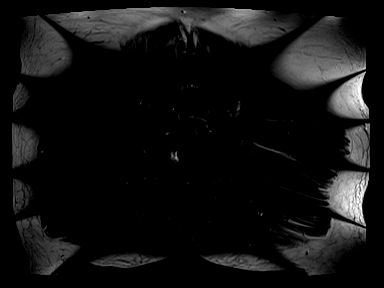
[im 37/37]
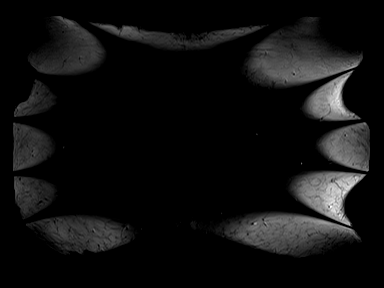

[Series 5: T2 fat-sat · axial · 3.0mm · 1.41mm/px · z∈[-69,+79]mm · 8 of 42 slices shown (2 of 2)]
[im 1/42]
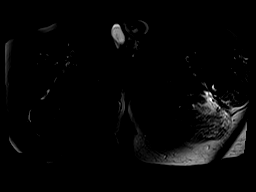
[im 5/42]
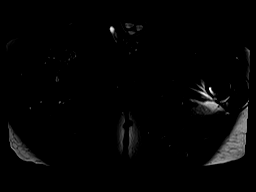
[im 14/42]
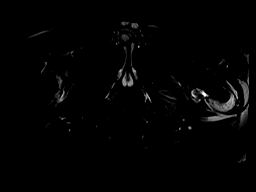
[im 19/42]
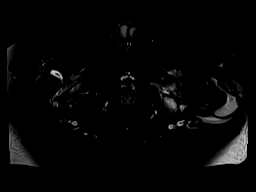
[im 23/42]
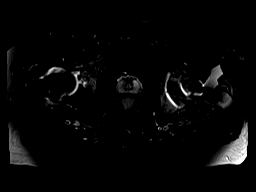
[im 28/42]
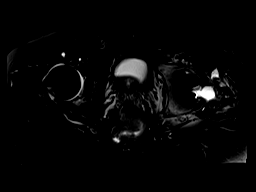
[im 37/42]
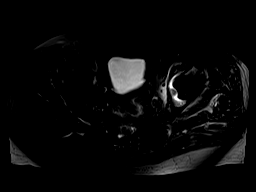
[im 42/42]
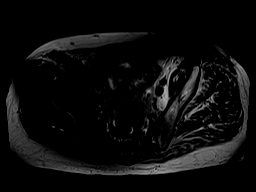

[Series 6: PD fat-sat · sagittal · 4.5mm · 0.39mm/px · 6 of 24 slices shown (1 of 2)]
[im 1/24]
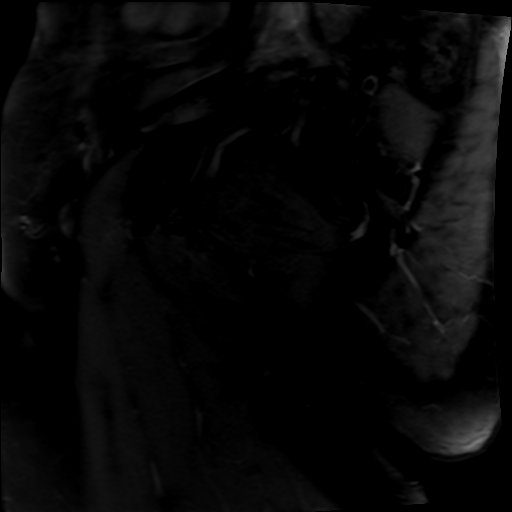
[im 5/24]
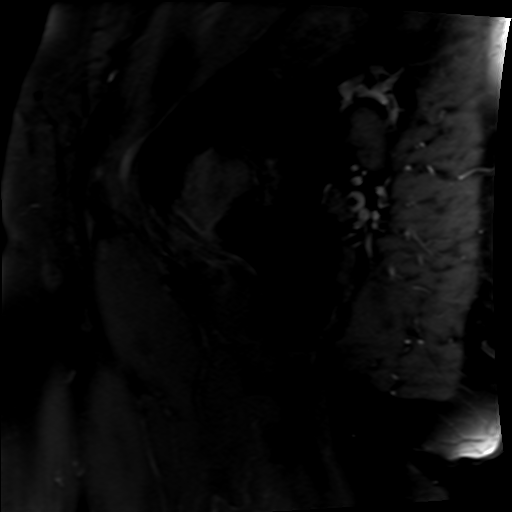
[im 10/24]
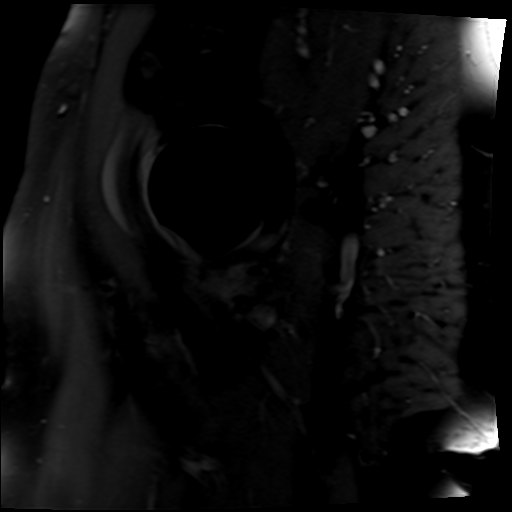
[im 14/24]
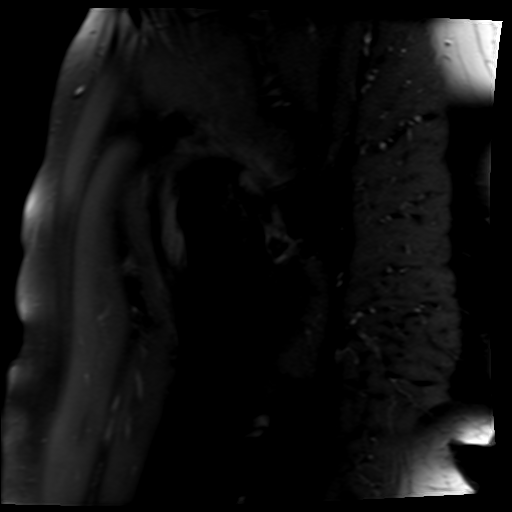
[im 19/24]
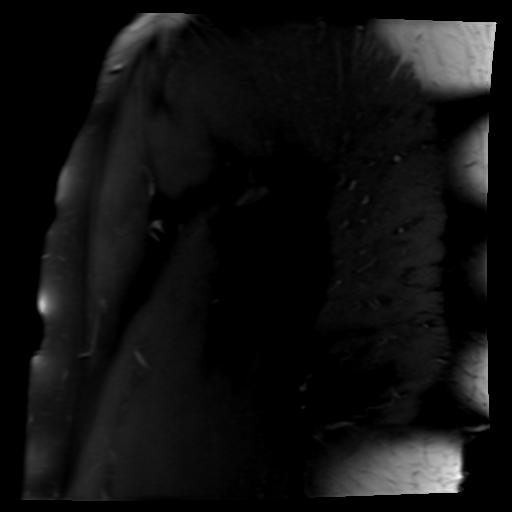
[im 24/24]
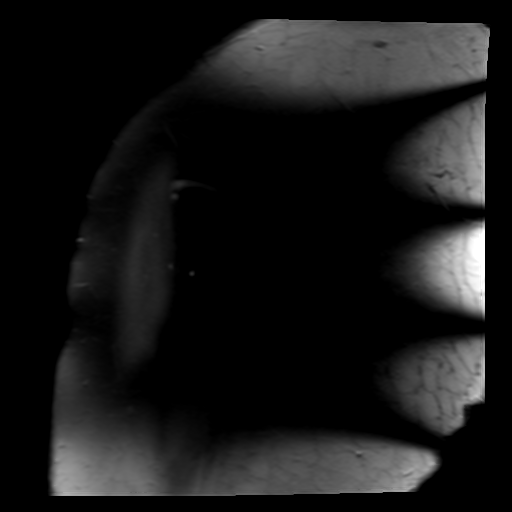

[Series 7: PD fat-sat · coronal · 4.5mm · 0.35mm/px · 6 of 25 slices shown (2 of 2)]
[im 1/25]
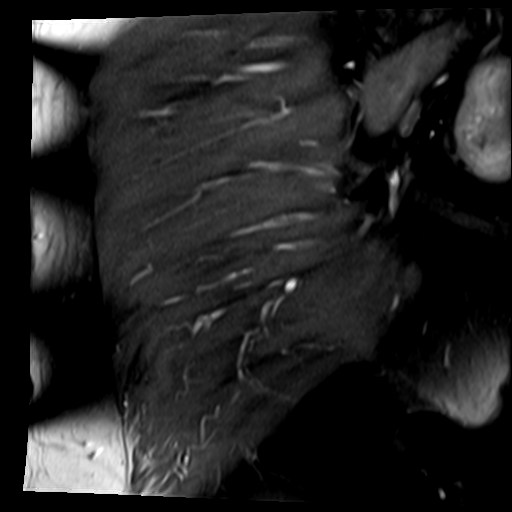
[im 5/25]
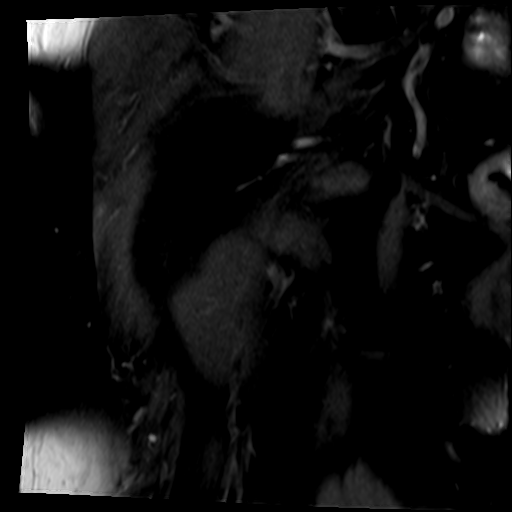
[im 10/25]
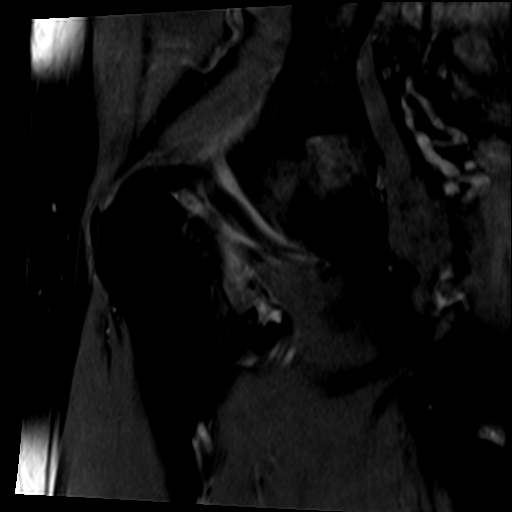
[im 15/25]
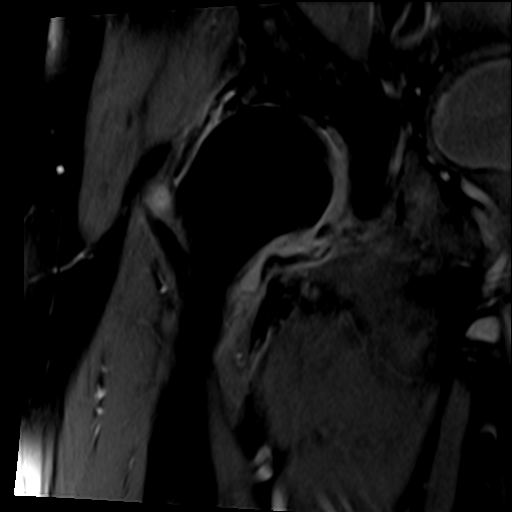
[im 20/25]
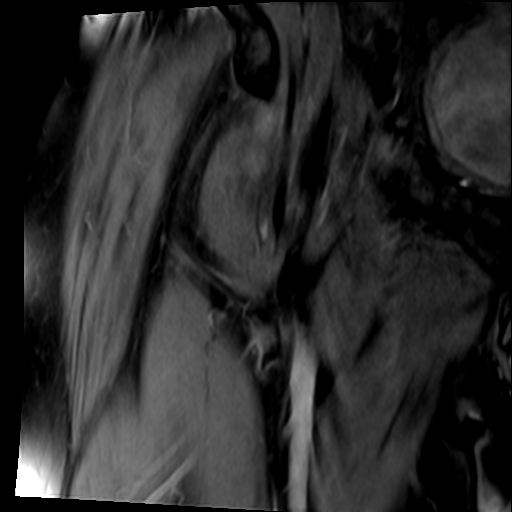
[im 25/25]
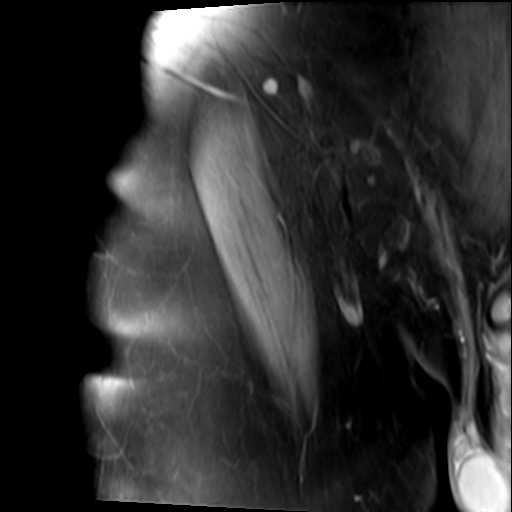

[31 of 40 positions shown; findings below may reference images not displayed]

FINDINGS: Bones: No acute fracture. No dislocation. No femoral head avascular
necrosis. Status post left total hip arthroplasty. Periprosthetic
fluid collection along the anterolateral aspect of the left femoral
neck component measuring approximately 4.0 x 2.0 x 4.0 cm. Bony
pelvis intact without diastasis. Mild degenerative changes of the SI
joints and pubic symphysis. Advanced lower lumbar degenerative disc
disease and facet arthropathy. No bone marrow edema. No marrow
replacing bone lesion.

Articular cartilage and labrum

Articular cartilage: Extensive full-thickness cartilage loss
involving both the femoral head and acetabulum at the superior
aspect of the right hip joint. Reactive subchondral marrow changes
within the superior acetabulum.

Labrum:  Superior labrum is degenerated and torn.

Joint or bursal effusion

Joint effusion:  Small-moderate sized right hip joint effusion.

Bursae: No abnormal bursal fluid collection.

Muscles and tendons

Muscles and tendons: The gluteal, hamstring, iliopsoas, rectus
femoris, and adductor tendons appear intact without tear or
significant tendinosis. Mild intramuscular edema within the
bilateral adductor muscle compartment. Otherwise normal muscle bulk
and signal intensity without edema, atrophy, or fatty infiltration.

Other findings

Miscellaneous: No soft tissue edema or fluid collection. No inguinal
lymphadenopathy.
IMPRESSION: 1. Severe right hip osteoarthritis with extensive full-thickness
cartilage loss.
2. Small-to-moderate sized right hip joint effusion, likely
reactive.
3. Status post left total hip arthroplasty. Periprosthetic fluid
collection along the anterolateral aspect of the left femoral neck
component measuring up to 4.0 cm, likely related to postoperative
seroma or hematoma given recent surgery.
4. Mild intramuscular edema within the bilateral adductor muscle
compartment, which may reflect muscle strains.

## 2021-12-29 ENCOUNTER — Telehealth: Payer: Self-pay | Admitting: Orthopaedic Surgery

## 2021-12-29 NOTE — Telephone Encounter (Signed)
PT is calling wants to know if you can do the MRI review over the phone  ?

## 2021-12-29 NOTE — Telephone Encounter (Signed)
Please advise 

## 2022-01-09 ENCOUNTER — Telehealth: Payer: Self-pay | Admitting: Orthopaedic Surgery

## 2022-01-09 NOTE — Telephone Encounter (Signed)
Patient aware of the below message and will call if he needs a referral ?His insurance no longer covers [name]  ?

## 2022-01-09 NOTE — Telephone Encounter (Signed)
Patient called asked if Dr. Magnus Ivan would call him concerning issues he's having with his back? Patient said he can't stand up straight, numbness in both legs ( mainly left side) Patient asked if he should have surgery on his back first before he have the surgery on his right hip?  The number to contact patient is [phone] ?

## 2022-01-11 ENCOUNTER — Telehealth: Payer: Self-pay | Admitting: Orthopaedic Surgery

## 2022-01-11 NOTE — Telephone Encounter (Signed)
Pt called requesting a referral to one of our back surgeon. Pt would like to be seen and set for a consultation for back surgery. Please call pt when referral is sent to set appt. Pt phone number is [phone]. ?

## 2022-01-24 ENCOUNTER — Ambulatory Visit: Payer: 59 | Admitting: Student

## 2022-01-24 NOTE — Progress Notes (Deleted)
   Subjective:  Patient ID: Carlos Grant, male    DOB: 06/30/1961, 61 y.o.   MRN: [id number]  CC: New Patient  HPI:  [name] is a very pleasant 61 y.o. male who presents today to establish care.   PMHx: Past Medical History:  Diagnosis Date   Arthritis    Family history of adverse reaction to anesthesia    PONV (postoperative nausea and vomiting)     Surgical Hx: Past Surgical History:  Procedure Laterality Date   TOTAL HIP ARTHROPLASTY Left 10/21/2021   Procedure: Left TOTAL HIP ARTHROPLASTY ANTERIOR APPROACH;  Surgeon: Kathryne Hitch, MD;  Location: [facility];  Service: Orthopedics;  Laterality: Left;    Family Hx: No family history on file.  Social Hx: Current Social History   (Please include date ( .td) when updating information )  Who lives at home: *** 01/24/2022  Who would speak for you about health care matters: *** 01/24/2022  Transportation: *** 01/24/2022 Important Relationships & Pets: *** 01/24/2022  Current Stressors: *** 01/24/2022 Work / Education:  *** 01/24/2022 Religious / Personal Beliefs: *** 01/24/2022 Interests / Fun: *** 01/24/2022 Other: *** 01/24/2022  Medications:  Preventative Screening Colonoscopy: year*** results *** Mammogram: year*** results *** Pap test: year*** results *** PSA: year*** results *** DEXA: year*** results *** Tetanus vaccine: year*** results *** Pneumonia vaccine: year*** results *** Shingles vaccine: year*** results *** Heart stress test: year*** results *** Echocardiogram: year*** results *** Xrays: year*** results *** CT/MRI: year*** results ***  Smoking status reviewed  Objective:  There were no vitals taken for this visit. Vitals and nursing note reviewed  General: NAD, pleasant, able to participate in exam HEENT: normocephalic, TM's visualized bilaterally, no scleral icterus or conjunctival pallor, no nasal discharge, moist mucous membranes, good dentition without erythema or discharge  noted in posterior oropharynx Neck: supple, non-tender, without lymphadenopathy Cardiac: RRR, S1 S2 present. normal heart sounds, no murmurs. Respiratory: CTAB, normal effort, No wheezes, rales or rhonchi Abdomen: Normoactive bowel sounds, non-tender, non-distended, no hepatosplenomegaly Extremities: no edema or cyanosis. Skin: warm and dry, no rashes noted Neuro: alert, no obvious focal deficits Psych: Normal affect and mood  Assessment & Plan:  No problem-specific Assessment & Plan notes found for this encounter.   No orders of the defined types were placed in this encounter.  No orders of the defined types were placed in this encounter.  No follow-ups on file. Shelby Mattocks, DO 01/24/2022, 8:34 AM PGY-***, [facility]

## 2022-02-01 ENCOUNTER — Ambulatory Visit (INDEPENDENT_AMBULATORY_CARE_PROVIDER_SITE_OTHER): Payer: 59 | Admitting: Adult Health

## 2022-02-01 ENCOUNTER — Encounter: Payer: Self-pay | Admitting: Adult Health

## 2022-02-01 VITALS — BP 120/80 | HR 82 | Temp 98.4°F | Ht 66.25 in | Wt 160.0 lb

## 2022-02-01 DIAGNOSIS — M545 Low back pain, unspecified: Secondary | ICD-10-CM

## 2022-02-01 DIAGNOSIS — G8929 Other chronic pain: Secondary | ICD-10-CM

## 2022-02-01 DIAGNOSIS — Z1211 Encounter for screening for malignant neoplasm of colon: Secondary | ICD-10-CM | POA: Diagnosis not present

## 2022-02-01 DIAGNOSIS — Z7689 Persons encountering health services in other specified circumstances: Secondary | ICD-10-CM

## 2022-02-01 MED ORDER — MELOXICAM 15 MG PO TABS: 15.0000 mg | ORAL_TABLET | Freq: Every day | ORAL | 3 refills | Status: DC

## 2022-02-01 NOTE — Progress Notes (Signed)
Patient presents to clinic today to establish care. He is a pleasant 61 year old male who  has a past medical history of Arthritis, Family history of adverse reaction to anesthesia, and PONV (postoperative nausea and vomiting).  He was formally seen at [facility].   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  When he was seen by orthopedics for follow-up in April 2023 he was complaining of severe right hip pain MRI showed severe right hip osteoarthritis with extensive full-thickness cartilage loss.  Unortunately, he is also developed worsening back pain and has appointment with Dr. Ophelia Charter to work-up the cause of this.  The plan is for him to have likely back surgery and then have a right hip replacement.  In the meantime his pain is somewhat manageable with roughly 14 tabs of Motrin throughout the day.  He is wondering if there is anything else he can take to help alleviate the pain and cut back on the number of pills he is having to take.  Health Maintenance: Dental -- Does not have dental coverage  Vision -- Last year  Immunizations -- Refuses vaccinations at this time  Colonoscopy -- Never had     Past Medical History:  Diagnosis Date   Arthritis    Family history of adverse reaction to anesthesia    PONV (postoperative nausea and vomiting)     Past Surgical History:  Procedure Laterality Date   TOTAL HIP ARTHROPLASTY Left 10/21/2021   Procedure: Left TOTAL HIP ARTHROPLASTY ANTERIOR APPROACH;  Surgeon: Kathryne Hitch, MD;  Location: [facility];  Service: Orthopedics;  Laterality: Left;    Current Outpatient Medications on File Prior to Visit  Medication Sig Dispense Refill   ibuprofen (ADVIL) 200 MG tablet Take 800 mg by mouth 2 (two) times daily.     No current facility-administered medications on file prior to visit.    No Known  Allergies  Family History  Problem Relation Age of Onset   Arthritis Mother    Stroke Mother    Arthritis Father    Hearing loss Father    Arthritis Brother    Arthritis Maternal Grandmother    Heart attack Maternal Grandfather    Arthritis Paternal Grandfather    Heart attack Paternal Grandfather    Heart disease Paternal Grandfather    Hyperlipidemia Paternal Grandfather     Social History   Socioeconomic History   Marital status: Single    Spouse name: Not on file   Number of children: Not on file   Years of education: Not on file   Highest education level: Not on file  Occupational History   Not on file  Tobacco Use   Smoking status: Former    Packs/day: 1.00    Types: Cigarettes    Quit date: 2013    Years since quitting: 10.4   Smokeless tobacco: Never  Vaping Use   Vaping Use: Never used  Substance and Sexual Activity   Alcohol use: Yes    Alcohol/week: 5.0 standard drinks    Types: 5 Cans of beer per week    Comment: occas   Drug use: Never   Sexual activity: Not on file  Other Topics Concern   Not on file  Social History Narrative   Not on file   Social Determinants of Health   Financial Resource Strain: Not  on file  Food Insecurity: Not on file  Transportation Needs: Not on file  Physical Activity: Not on file  Stress: Not on file  Social Connections: Not on file  Intimate Partner Violence: Not on file    Review of Systems  Constitutional: Negative.   HENT: Negative.    Eyes: Negative.   Respiratory: Negative.    Cardiovascular: Negative.   Gastrointestinal: Negative.   Genitourinary: Negative.   Musculoskeletal: Negative.   Skin: Negative.   Neurological: Negative.   Endo/Heme/Allergies: Negative.   Psychiatric/Behavioral: Negative.     BP 120/80   Pulse 82   Temp 98.4 F (36.9 C) (Oral)   Ht 5' 6.25" (1.683 m)   Wt 160 lb (72.6 kg)   SpO2 98%   BMI 25.63 kg/m   Physical Exam Vitals and nursing note reviewed.   Constitutional:      General: He is not in acute distress.    Appearance: Normal appearance. He is well-developed and normal weight.  HENT:     Head: Normocephalic and atraumatic.     Right Ear: Tympanic membrane, ear canal and external ear normal. There is no impacted cerumen.     Left Ear: Tympanic membrane, ear canal and external ear normal. There is no impacted cerumen.     Nose: Nose normal. No congestion or rhinorrhea.     Mouth/Throat:     Mouth: Mucous membranes are moist.     Pharynx: Oropharynx is clear. No oropharyngeal exudate or posterior oropharyngeal erythema.  Eyes:     General:        Right eye: No discharge.        Left eye: No discharge.     Extraocular Movements: Extraocular movements intact.     Conjunctiva/sclera: Conjunctivae normal.     Pupils: Pupils are equal, round, and reactive to light.  Neck:     Vascular: No carotid bruit.     Trachea: No tracheal deviation.  Cardiovascular:     Rate and Rhythm: Normal rate and regular rhythm.     Pulses: Normal pulses.     Heart sounds: Normal heart sounds. No murmur heard.   No friction rub. No gallop.  Pulmonary:     Effort: Pulmonary effort is normal. No respiratory distress.     Breath sounds: Normal breath sounds. No stridor. No wheezing, rhonchi or rales.  Chest:     Chest wall: No tenderness.  Abdominal:     General: Bowel sounds are normal. There is no distension.     Palpations: Abdomen is soft. There is no mass.     Tenderness: There is no abdominal tenderness. There is no right CVA tenderness, left CVA tenderness, guarding or rebound.     Hernia: No hernia is present.  Musculoskeletal:        General: No swelling, tenderness, deformity or signs of injury. Normal range of motion.     Right lower leg: No edema.     Left lower leg: No edema.  Lymphadenopathy:     Cervical: No cervical adenopathy.  Skin:    General: Skin is warm and dry.     Capillary Refill: Capillary refill takes less than 2  seconds.     Coloration: Skin is not jaundiced or pale.     Findings: No bruising, erythema, lesion or rash.  Neurological:     General: No focal deficit present.     Mental Status: He is alert and oriented to person, place, and time.  Cranial Nerves: No cranial nerve deficit.     Sensory: No sensory deficit.     Motor: No weakness.     Coordination: Coordination normal.     Gait: Gait normal.     Deep Tendon Reflexes: Reflexes normal.  Psychiatric:        Mood and Affect: Mood normal.        Behavior: Behavior normal.        Thought Content: Thought content normal.        Judgment: Judgment normal.   Assessment/Plan: 1. Encounter to establish care - Follow up in the fall for CPE or sooner if needed  2. Colon cancer screening - Refuses colonoscopy  - Cologuard  3. Chronic midline low back pain without sciatica -We will prescribe Mobic 15 mg daily.  He was advised to not take any other anti-inflammatories while taking this medication.  Advise follow-up with orthopedics as directed - meloxicam (MOBIC) 15 MG tablet; Take 1 tablet (15 mg total) by mouth daily.  Dispense: 30 tablet; Refill: 3    Dorothyann Peng, NP

## 2022-02-01 NOTE — Patient Instructions (Addendum)
It was great meeting you today!   I have sent in a medication called Mobic to help you with the arthritic pain   I have also ordered cologuard for colon cancer screening   Follow up after November 9th for your annual exam

## 2022-02-15 ENCOUNTER — Ambulatory Visit: Payer: 59 | Admitting: Orthopaedic Surgery

## 2022-02-15 ENCOUNTER — Other Ambulatory Visit: Payer: Self-pay

## 2022-02-15 ENCOUNTER — Ambulatory Visit (INDEPENDENT_AMBULATORY_CARE_PROVIDER_SITE_OTHER): Payer: 59

## 2022-02-15 VITALS — BP 156/94 | HR 88

## 2022-02-15 DIAGNOSIS — M542 Cervicalgia: Secondary | ICD-10-CM

## 2022-02-15 DIAGNOSIS — M4712 Other spondylosis with myelopathy, cervical region: Secondary | ICD-10-CM | POA: Diagnosis not present

## 2022-02-15 DIAGNOSIS — M48061 Spinal stenosis, lumbar region without neurogenic claudication: Secondary | ICD-10-CM | POA: Diagnosis not present

## 2022-02-15 MED ORDER — DIAZEPAM 5 MG PO TABS: 5.0000 mg | ORAL_TABLET | ORAL | 0 refills | Status: DC | PRN

## 2022-02-15 NOTE — Progress Notes (Unsigned)
   Office Visit Note   Patient: Carlos Grant           Date of Birth: 01-31-1961           MRN: [id number] Visit Date: 02/15/2022              Requested by: Ladora Daniel, PA-C [address] [place],  [place] [address] PCP: Shirline Frees, NP   Assessment & Plan: Visit Diagnoses: No diagnosis found.  Plan: ***  Follow-Up Instructions: No follow-ups on file.   Orders:  No orders of the defined types were placed in this encounter.  No orders of the defined types were placed in this encounter.     Procedures: No procedures performed   Clinical Data: No additional findings.   Subjective: Chief Complaint  Patient presents with   Lower Back - Pain    HPI  Review of Systems   Objective: Vital Signs: BP (!) 156/94   Pulse 88   Physical Exam  Ortho Exam  Specialty Comments:  No specialty comments available.  Imaging: No results found.   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   Past Medical History:  Diagnosis Date   Arthritis    Family history of adverse reaction to anesthesia    PONV (postoperative nausea and vomiting)     Family History  Problem Relation Age of Onset   Arthritis Mother    Stroke Mother    Arthritis Father    Hearing loss Father    Arthritis Brother    Arthritis Maternal Grandmother    Heart attack Maternal Grandfather    Arthritis Paternal Grandfather    Heart attack Paternal Grandfather    Heart disease Paternal Grandfather    Hyperlipidemia Paternal Grandfather     Past Surgical History:  Procedure Laterality Date   TOTAL HIP ARTHROPLASTY Left 10/21/2021   Procedure: Left TOTAL HIP ARTHROPLASTY ANTERIOR APPROACH;  Surgeon: Kathryne Hitch, MD;  Location: [facility];  Service: Orthopedics;  Laterality: Left;   Social History   Occupational History   Not on file  Tobacco Use   Smoking  status: Former    Packs/day: 1.00    Types: Cigarettes    Quit date: 2013    Years since quitting: 10.4   Smokeless tobacco: Never  Vaping Use   Vaping Use: Never used  Substance and Sexual Activity   Alcohol use: Yes    Alcohol/week: 5.0 standard drinks of alcohol    Types: 5 Cans of beer per week    Comment: occas   Drug use: Never   Sexual activity: Not on file

## 2022-02-16 ENCOUNTER — Ambulatory Visit: Payer: 59 | Admitting: Student

## 2022-02-17 DIAGNOSIS — M48061 Spinal stenosis, lumbar region without neurogenic claudication: Secondary | ICD-10-CM | POA: Insufficient documentation

## 2022-02-17 DIAGNOSIS — M4712 Other spondylosis with myelopathy, cervical region: Secondary | ICD-10-CM | POA: Insufficient documentation

## 2022-02-20 LAB — COLOGUARD: COLOGUARD: NEGATIVE

## 2022-03-12 ENCOUNTER — Ambulatory Visit (HOSPITAL_COMMUNITY): Payer: 59

## 2022-03-28 ENCOUNTER — Ambulatory Visit: Payer: 59 | Admitting: Orthopaedic Surgery

## 2022-07-07 ENCOUNTER — Encounter: Payer: Commercial Managed Care - HMO | Admitting: Adult Health

## 2022-08-08 ENCOUNTER — Ambulatory Visit: Payer: Commercial Managed Care - HMO | Admitting: Physician Assistant

## 2022-08-11 ENCOUNTER — Ambulatory Visit: Payer: Commercial Managed Care - HMO | Admitting: Physician Assistant

## 2023-05-04 DIAGNOSIS — M5412 Radiculopathy, cervical region: Secondary | ICD-10-CM | POA: Diagnosis not present

## 2023-05-04 DIAGNOSIS — G959 Disease of spinal cord, unspecified: Secondary | ICD-10-CM | POA: Diagnosis not present

## 2023-05-09 ENCOUNTER — Other Ambulatory Visit: Payer: Self-pay | Admitting: Neurological Surgery

## 2023-05-09 ENCOUNTER — Other Ambulatory Visit (HOSPITAL_COMMUNITY): Payer: Self-pay | Admitting: Neurological Surgery

## 2023-05-09 DIAGNOSIS — G959 Disease of spinal cord, unspecified: Secondary | ICD-10-CM

## 2023-05-16 ENCOUNTER — Ambulatory Visit (HOSPITAL_COMMUNITY): Payer: Medicaid Other

## 2023-05-16 ENCOUNTER — Encounter (HOSPITAL_COMMUNITY): Payer: Self-pay

## 2023-05-25 DIAGNOSIS — M48062 Spinal stenosis, lumbar region with neurogenic claudication: Secondary | ICD-10-CM | POA: Diagnosis not present

## 2023-06-11 ENCOUNTER — Ambulatory Visit (HOSPITAL_COMMUNITY)
Admission: RE | Admit: 2023-06-11 | Discharge: 2023-06-11 | Disposition: A | Discharge status: Home / Self Care | Payer: Medicaid Other | Source: Ambulatory Visit | Attending: Neurological Surgery | Admitting: Neurological Surgery

## 2023-06-11 ENCOUNTER — Ambulatory Visit (HOSPITAL_COMMUNITY): Admission: RE | Admit: 2023-06-11 | Payer: Medicaid Other | Source: Ambulatory Visit

## 2023-06-11 ENCOUNTER — Encounter (HOSPITAL_COMMUNITY): Payer: Self-pay

## 2023-06-11 DIAGNOSIS — M5412 Radiculopathy, cervical region: Secondary | ICD-10-CM

## 2023-06-14 ENCOUNTER — Other Ambulatory Visit: Payer: Self-pay | Admitting: Neurological Surgery

## 2023-06-14 DIAGNOSIS — G959 Disease of spinal cord, unspecified: Secondary | ICD-10-CM

## 2023-06-25 NOTE — Discharge Instructions (Signed)

## 2023-06-26 ENCOUNTER — Ambulatory Visit
Admission: RE | Admit: 2023-06-26 | Discharge: 2023-06-26 | Disposition: A | Discharge status: Home / Self Care | Payer: Medicaid Other | Source: Ambulatory Visit | Attending: Neurological Surgery | Admitting: Neurological Surgery

## 2023-06-26 ENCOUNTER — Other Ambulatory Visit: Payer: Medicaid Other

## 2023-06-26 ENCOUNTER — Other Ambulatory Visit: Payer: Self-pay | Admitting: Neurological Surgery

## 2023-06-26 DIAGNOSIS — M47814 Spondylosis without myelopathy or radiculopathy, thoracic region: Secondary | ICD-10-CM | POA: Diagnosis not present

## 2023-06-26 DIAGNOSIS — M4714 Other spondylosis with myelopathy, thoracic region: Secondary | ICD-10-CM

## 2023-06-26 DIAGNOSIS — M4807 Spinal stenosis, lumbosacral region: Secondary | ICD-10-CM | POA: Diagnosis not present

## 2023-06-26 DIAGNOSIS — M4802 Spinal stenosis, cervical region: Secondary | ICD-10-CM | POA: Diagnosis not present

## 2023-06-26 DIAGNOSIS — M5124 Other intervertebral disc displacement, thoracic region: Secondary | ICD-10-CM | POA: Diagnosis not present

## 2023-06-26 DIAGNOSIS — G959 Disease of spinal cord, unspecified: Secondary | ICD-10-CM

## 2023-06-26 DIAGNOSIS — M48061 Spinal stenosis, lumbar region without neurogenic claudication: Secondary | ICD-10-CM | POA: Diagnosis not present

## 2023-06-26 DIAGNOSIS — M5416 Radiculopathy, lumbar region: Secondary | ICD-10-CM

## 2023-06-26 DIAGNOSIS — M5412 Radiculopathy, cervical region: Secondary | ICD-10-CM | POA: Diagnosis not present

## 2023-06-26 DIAGNOSIS — M4804 Spinal stenosis, thoracic region: Secondary | ICD-10-CM | POA: Diagnosis not present

## 2023-06-26 MED ORDER — DIAZEPAM 5 MG PO TABS
10.0000 mg | ORAL_TABLET | Freq: Once | ORAL | Status: AC
  Administered 2023-06-26: 10 mg via ORAL

## 2023-06-26 MED ORDER — IOPAMIDOL (ISOVUE-M 300) INJECTION 61%
10.0000 mL | Freq: Once | INTRAMUSCULAR | Status: AC
  Administered 2023-06-26: 10 mL via INTRATHECAL

## 2023-06-26 MED ORDER — ONDANSETRON HCL 4 MG/2ML IJ SOLN
4.0000 mg | Freq: Once | INTRAMUSCULAR | Status: AC | PRN
  Administered 2023-06-26: 4 mg via INTRAMUSCULAR

## 2023-06-26 MED ORDER — MEPERIDINE HCL 50 MG/ML IJ SOLN
50.0000 mg | Freq: Once | INTRAMUSCULAR | Status: AC | PRN
  Administered 2023-06-26: 50 mg via INTRAMUSCULAR

## 2023-06-26 NOTE — Progress Notes (Signed)
Pt recovering at nurses station. Pt reports improvement in pain as 7/10.

## 2023-06-26 NOTE — Progress Notes (Signed)
Pt complains of 10/10 pain level during myelogram. Medication given-see MAR

## 2023-07-09 DIAGNOSIS — G959 Disease of spinal cord, unspecified: Secondary | ICD-10-CM | POA: Diagnosis not present

## 2023-07-13 ENCOUNTER — Other Ambulatory Visit: Payer: Self-pay | Admitting: Neurological Surgery

## 2023-07-18 ENCOUNTER — Other Ambulatory Visit: Payer: Self-pay | Admitting: Neurosurgery

## 2023-07-31 DIAGNOSIS — G959 Disease of spinal cord, unspecified: Secondary | ICD-10-CM | POA: Diagnosis not present

## 2023-08-07 ENCOUNTER — Other Ambulatory Visit: Payer: Self-pay

## 2023-08-07 ENCOUNTER — Encounter (HOSPITAL_COMMUNITY): Payer: Self-pay | Admitting: Neurological Surgery

## 2023-08-07 NOTE — Progress Notes (Signed)
SDW CALL  Patient was given pre-op instructions over the phone. The opportunity was given for the patient to ask questions. No further questions asked. Patient verbalized understanding of instructions given.   PCP - Shirline Frees Cardiologist - denies  PPM/ICD - denies Device Orders - n/a Rep Notified - n/a  Chest x-ray - denies EKG - denies Stress Test - denies ECHO - denies Cardiac Cath - denies  Sleep Study - denies CPAP - n/a  Fasting Blood Sugar - n/a  Blood Thinner Instructions: n/a Aspirin Instructions: n/a  ERAS Protcol - NPO   COVID TEST- n/a   Anesthesia review: no  Patient denies shortness of breath, fever, cough and chest pain over the phone call   All instructions explained to the patient, with a verbal understanding of the material. Patient agrees to go over the instructions while at home for a better understanding.

## 2023-08-08 ENCOUNTER — Encounter (HOSPITAL_COMMUNITY): Payer: Self-pay

## 2023-08-09 ENCOUNTER — Inpatient Hospital Stay (HOSPITAL_COMMUNITY): Admission: RE | Admit: 2023-08-09 | Payer: Medicaid Other | Source: Home / Self Care | Admitting: Neurological Surgery

## 2023-08-09 SURGERY — ANTERIOR CERVICAL DECOMPRESSION/DISCECTOMY FUSION 4 LEVELS
Anesthesia: General

## 2023-08-14 ENCOUNTER — Other Ambulatory Visit: Payer: Self-pay | Admitting: Neurological Surgery

## 2023-08-17 NOTE — Progress Notes (Signed)
SDW call attempt.  Spoke to patient, he woke with a temp of 101 degrees and needs to reschedule.  Notified Angie at Dr. Verlee Rossetti office as well as OR scheduling desk.

## 2023-08-20 ENCOUNTER — Inpatient Hospital Stay (HOSPITAL_COMMUNITY): Admission: RE | Admit: 2023-08-20 | Payer: Medicaid Other | Source: Home / Self Care | Admitting: Neurological Surgery

## 2023-08-20 ENCOUNTER — Encounter (HOSPITAL_COMMUNITY): Admission: RE | Payer: Self-pay | Source: Home / Self Care

## 2023-08-20 SURGERY — ANTERIOR CERVICAL DECOMPRESSION/DISCECTOMY FUSION 4 LEVELS
Anesthesia: General

## 2023-09-20 ENCOUNTER — Inpatient Hospital Stay (HOSPITAL_COMMUNITY): Admission: RE | Admit: 2023-09-20 | Payer: Medicaid Other | Source: Home / Self Care | Admitting: Neurological Surgery

## 2023-09-20 ENCOUNTER — Encounter (HOSPITAL_COMMUNITY): Admission: RE | Payer: Self-pay | Source: Home / Self Care

## 2023-09-20 SURGERY — ANTERIOR CERVICAL DECOMPRESSION/DISCECTOMY FUSION 4 LEVELS
Anesthesia: General

## 2023-11-19 ENCOUNTER — Other Ambulatory Visit (INDEPENDENT_AMBULATORY_CARE_PROVIDER_SITE_OTHER): Payer: Self-pay

## 2023-11-19 ENCOUNTER — Ambulatory Visit (INDEPENDENT_AMBULATORY_CARE_PROVIDER_SITE_OTHER): Payer: Medicaid Other | Admitting: Orthopaedic Surgery

## 2023-11-19 ENCOUNTER — Encounter: Payer: Self-pay | Admitting: Orthopaedic Surgery

## 2023-11-19 DIAGNOSIS — Z96642 Presence of left artificial hip joint: Secondary | ICD-10-CM | POA: Diagnosis not present

## 2023-11-19 DIAGNOSIS — M1611 Unilateral primary osteoarthritis, right hip: Secondary | ICD-10-CM | POA: Diagnosis not present

## 2023-11-19 NOTE — Progress Notes (Signed)
 The patient is well-known to [place].  We replaced his left hip 2 years ago.  Back then his right hip had some arthritic changes but those were minimal.  He does have significant right hip pain but has been having significant left hip pain and pain in his left thigh.  He does see [name] and is having extensive cervical spine surgery next month on 14 April.  He is in need of lumbar spine surgery based on reviewing notes and what the patient is saying combined with all the imaging studies that is done of his lumbar spine.  His left hip moves smoothly and fluidly today.  His right hip has significant limitations with range of motion.  An AP pelvis and lateral of the left hip shows a well-seated left total hip arthroplasty with no complicating features.  However the right hip has progressive and worsening arthritis with now bone-on-bone wear and the femoral head digging into the acetabulum.  This is significantly worse from 2 years ago and I believe this is contributing to him having left hip issues combined with his lumbar spine issues.  At this point I have recommended a total hip arthroplasty for his right hip once he is successfully recovered from his spine surgery.  He has her surgery scheduler's card and will be in touch and let us know.

## 2023-11-27 ENCOUNTER — Inpatient Hospital Stay (HOSPITAL_COMMUNITY): Admission: RE | Admit: 2023-11-27 | Source: Ambulatory Visit

## 2023-12-10 ENCOUNTER — Inpatient Hospital Stay (HOSPITAL_COMMUNITY): Admit: 2023-12-10 | Payer: Medicaid Other | Admitting: Neurological Surgery

## 2023-12-10 SURGERY — ANTERIOR CERVICAL DECOMPRESSION/DISCECTOMY FUSION 4 LEVELS
Anesthesia: General

## 2023-12-26 ENCOUNTER — Other Ambulatory Visit (HOSPITAL_COMMUNITY): Payer: Self-pay | Admitting: Orthopedic Surgery

## 2023-12-26 DIAGNOSIS — M542 Cervicalgia: Secondary | ICD-10-CM

## 2023-12-26 DIAGNOSIS — G959 Disease of spinal cord, unspecified: Secondary | ICD-10-CM | POA: Diagnosis not present

## 2023-12-26 DIAGNOSIS — M5412 Radiculopathy, cervical region: Secondary | ICD-10-CM | POA: Diagnosis not present

## 2023-12-31 ENCOUNTER — Encounter (HOSPITAL_COMMUNITY): Payer: Self-pay

## 2023-12-31 ENCOUNTER — Ambulatory Visit (HOSPITAL_COMMUNITY): Attending: Orthopedic Surgery

## 2024-01-02 ENCOUNTER — Encounter: Payer: Self-pay | Admitting: Adult Health

## 2024-01-02 ENCOUNTER — Ambulatory Visit

## 2024-01-02 ENCOUNTER — Ambulatory Visit (INDEPENDENT_AMBULATORY_CARE_PROVIDER_SITE_OTHER): Admitting: Adult Health

## 2024-01-02 VITALS — BP 120/80 | HR 88 | Temp 98.4°F | Ht 66.5 in | Wt 164.0 lb

## 2024-01-02 DIAGNOSIS — R0609 Other forms of dyspnea: Secondary | ICD-10-CM

## 2024-01-02 DIAGNOSIS — R6889 Other general symptoms and signs: Secondary | ICD-10-CM

## 2024-01-02 DIAGNOSIS — M2550 Pain in unspecified joint: Secondary | ICD-10-CM | POA: Diagnosis not present

## 2024-01-02 LAB — CBC
HCT: 46.6 % (ref 39.0–52.0)
Hemoglobin: 15.6 g/dL (ref 13.0–17.0)
MCHC: 33.4 g/dL (ref 30.0–36.0)
MCV: 95.7 fl (ref 78.0–100.0)
Platelets: 245 10*3/uL (ref 150.0–400.0)
RBC: 4.87 Mil/uL (ref 4.22–5.81)
RDW: 13.9 % (ref 11.5–15.5)
WBC: 7.1 10*3/uL (ref 4.0–10.5)

## 2024-01-02 LAB — SEDIMENTATION RATE: Sed Rate: 13 mm/h (ref 0–20)

## 2024-01-02 LAB — COMPREHENSIVE METABOLIC PANEL WITH GFR
ALT: 12 U/L (ref 0–53)
AST: 20 U/L (ref 0–37)
Albumin: 4.4 g/dL (ref 3.5–5.2)
Alkaline Phosphatase: 72 U/L (ref 39–117)
BUN: 14 mg/dL (ref 6–23)
CO2: 26 meq/L (ref 19–32)
Calcium: 9.3 mg/dL (ref 8.4–10.5)
Chloride: 104 meq/L (ref 96–112)
Creatinine, Ser: 0.91 mg/dL (ref 0.40–1.50)
GFR: 91.68 mL/min (ref >60.00)
Glucose, Bld: 110 mg/dL — ABNORMAL HIGH (ref 70–99)
Potassium: 4.1 meq/L (ref 3.5–5.1)
Sodium: 138 meq/L (ref 135–145)
Total Bilirubin: 0.8 mg/dL (ref 0.2–1.2)
Total Protein: 7.3 g/dL (ref 6.0–8.3)

## 2024-01-02 LAB — TSH: TSH: 3.29 u[IU]/mL (ref 0.35–5.50)

## 2024-01-02 LAB — C-REACTIVE PROTEIN: CRP: <1 mg/dL (ref 0.5–20.0)

## 2024-01-02 NOTE — Patient Instructions (Signed)
 Please go to lab and xray  We will call you when we get your labs and xray

## 2024-01-02 NOTE — Progress Notes (Signed)
 Subjective:    Patient ID: Carlos Grant, male    DOB: [birth year], 63 y.o.   MRN: [id number]  HPI 63 year old male who  has a past medical history of Arthritis.  He presents to the office today for an acute visit.  He reports that when he starts exerting himself even minimally he will start sweating profusely and his body temperature goes up.  He has checked his temperature when he feels warm during these episodes and reports temperature up to 101.5.  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  He has worked outside for quite some time and has been bitten by multiple ticks.  He denies rashes currently and has not noticed any in the past.    His symptoms have been present for about a year.     Review of Systems See HPI   Past Medical History:  Diagnosis Date   Arthritis     Social History   Socioeconomic History   Marital status: Single    Spouse name: Not on file   Number of children: Not on file   Years of education: Not on file   Highest education level: Not on file  Occupational History   Not on file  Tobacco Use   Smoking status: Former    Current packs/day: 0.00    Types: Cigarettes    Quit date: 2013    Years since quitting: 12.3   Smokeless tobacco: Never  Vaping Use   Vaping status: Never Used  Substance and Sexual Activity   Alcohol use: Yes    Alcohol/week: 5.0 standard drinks of alcohol    Types: 5 Cans of beer per week    Comment: 2-3 a night   Drug use: Never   Sexual activity: Not on file  Other Topics Concern   Not on file  Social History Narrative   Not on file   Social Drivers of Health   Financial Resource Strain: Not on file  Food Insecurity: No Food Insecurity (07/06/2021)   Received from [facility], [facility]   Hunger Vital Sign    Worried About Running Out of Food in  the Last Year: Never true    Ran Out of Food in the Last Year: Never true  Transportation Needs: Not on file  Physical Activity: Not on file  Stress: Not on file  Social Connections: Unknown (01/10/2022)   Received from [facility], [facility]   Social Network    Social Network: Not on file  Intimate Partner Violence: Unknown (12/02/2021)   Received from [facility], [facility]   HITS    Physically Hurt: Not on file    Insult or Talk Down To: Not on file    Threaten Physical Harm: Not on file    Scream or Curse: Not on file    Past Surgical History:  Procedure Laterality Date   TOTAL HIP ARTHROPLASTY Left 10/21/2021   Procedure: Left TOTAL HIP ARTHROPLASTY ANTERIOR APPROACH;  Surgeon: Arnie Lao, MD;  Location: [facility];  Service: Orthopedics;  Laterality: Left;    Family History  Problem Relation Age of Onset   Arthritis Mother    Stroke Mother    Arthritis Father    Hearing loss Father    Arthritis Brother    Arthritis Maternal Grandmother  Heart attack Maternal Grandfather    Arthritis Paternal Grandfather    Heart attack Paternal Grandfather    Heart disease Paternal Grandfather    Hyperlipidemia Paternal Grandfather     No Known Allergies  Current Outpatient Medications on File Prior to Visit  Medication Sig Dispense Refill   ibuprofen (ADVIL) 200 MG tablet Take 1,400 mg by mouth 2 (two) times daily.     No current facility-administered medications on file prior to visit.    BP 120/80   Pulse 88   Temp 98.4 F (36.9 C) (Oral)   Ht 5' 6.5" (1.689 m)   Wt 164 lb (74.4 kg)   SpO2 97%   BMI 26.07 kg/m       Objective:   Physical Exam Vitals and nursing note reviewed.  Constitutional:      Appearance: Normal appearance.  Cardiovascular:     Rate and Rhythm: Normal rate and regular rhythm.     Pulses: Normal pulses.     Heart sounds: Normal heart sounds.  Pulmonary:     Effort: Pulmonary effort is normal.     Breath sounds:  Normal breath sounds.  Musculoskeletal:        General: Normal range of motion.  Skin:    General: Skin is warm and dry.  Neurological:     General: No focal deficit present.     Mental Status: He is alert and oriented to person, place, and time.  Psychiatric:        Mood and Affect: Mood normal.        Behavior: Behavior normal.        Thought Content: Thought content normal.        Judgment: Judgment normal.        Assessment & Plan:   1. Heat intolerance (Primary) - Check TSH as I cannot rule out thyroid disorder such as hyperthyroidism.  Will also check CMP to look for elevated blood sugar.  He does not look acutely ill, I doubt [facility] spotted fever but will check anyway He does not appear to have acute Lyme disease.  If lab work comes back normal we will likely need to send to cardiology for stress testing. ? Hyperhidrosis.  - TSH; Future - CBC; Future - B. burgdorfi Antibody; Future - [place] spotted fvr abs pnl(IgG+IgM); Future - C-reactive Protein; Future - Sedimentation Rate; Future - Comprehensive metabolic panel with GFR; Future - DG Chest 2 View; Future - Comprehensive metabolic panel with GFR - Sedimentation Rate - C-reactive Protein - [place] spotted fvr abs pnl(IgG+IgM) - B. burgdorfi Antibody - CBC - TSH  2. 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 He does have an appointment with a new neurosurgeon coming up.  - TSH; Future - CBC; Future - B. burgdorfi Antibody; Future - [place] mtn spotted fvr abs pnl(IgG+IgM); Future - C-reactive Protein; Future - Sedimentation Rate; Future - Comprehensive metabolic panel with GFR; Future - DG Chest 2 View; Future - Comprehensive metabolic panel with GFR - Sedimentation Rate - C-reactive Protein - [place] spotted fvr abs pnl(IgG+IgM) - B. burgdorfi Antibody - CBC - TSH  3. DOE (dyspnea on exertion)  - TSH; Future - CBC; Future - B. burgdorfi Antibody;  Future - [place] spotted fvr abs pnl(IgG+IgM); Future - C-reactive Protein; Future - Sedimentation Rate; Future - Comprehensive metabolic panel with GFR; Future - DG Chest 2 View; Future - Comprehensive metabolic panel with GFR - Sedimentation  Rate - C-reactive Protein - [place] mtn spotted fvr abs pnl(IgG+IgM) - B. burgdorfi Antibody - CBC - TSH   Alto Atta, NP  Time spent with patient today was 32 minutes which consisted of chart review, discussing heat intolerance, joint pain, and DOE, work up, treatment answering questions and documentation.

## 2024-01-05 LAB — B. BURGDORFI ANTIBODIES BY WB
B burgdorferi IgG Abs (IB): NEGATIVE
B burgdorferi IgM Abs (IB): NEGATIVE
Lyme Disease 18 kD IgG: NONREACTIVE
Lyme Disease 23 kD IgG: NONREACTIVE
Lyme Disease 23 kD IgM: NONREACTIVE
Lyme Disease 28 kD IgG: NONREACTIVE
Lyme Disease 30 kD IgG: NONREACTIVE
Lyme Disease 39 kD IgG: NONREACTIVE
Lyme Disease 39 kD IgM: NONREACTIVE
Lyme Disease 41 kD IgG: REACTIVE — AB
Lyme Disease 41 kD IgM: NONREACTIVE
Lyme Disease 45 kD IgG: NONREACTIVE
Lyme Disease 58 kD IgG: REACTIVE — AB
Lyme Disease 66 kD IgG: NONREACTIVE
Lyme Disease 93 kD IgG: NONREACTIVE

## 2024-01-05 LAB — ROCKY MTN SPOTTED FVR ABS PNL(IGG+IGM)
RMSF IgG: NOT DETECTED
RMSF IgM: NOT DETECTED

## 2024-01-05 LAB — LYME AB SCREEN RFLX: Lyme AB Screen: 1.12 {index} — ABNORMAL HIGH

## 2024-01-09 ENCOUNTER — Other Ambulatory Visit: Payer: Self-pay | Admitting: Adult Health

## 2024-01-09 ENCOUNTER — Ambulatory Visit: Payer: Self-pay

## 2024-01-09 DIAGNOSIS — R0602 Shortness of breath: Secondary | ICD-10-CM

## 2024-01-09 DIAGNOSIS — R5383 Other fatigue: Secondary | ICD-10-CM

## 2024-01-09 MED ORDER — DOXYCYCLINE HYCLATE 100 MG PO CAPS: 100.0000 mg | ORAL_CAPSULE | Freq: Two times a day (BID) | ORAL | 0 refills | Status: DC

## 2024-01-10 ENCOUNTER — Ambulatory Visit: Payer: Self-pay | Admitting: Adult Health

## 2024-01-10 ENCOUNTER — Telehealth: Payer: Self-pay

## 2024-01-10 NOTE — Telephone Encounter (Signed)
 Patient called in with questions about why doxycyline was sent to his pharmacy. This pt read the following results per Alto Atta, NP:  "Lab work for [employer] spotted fever was negative. His lab work for Lyme disease did show reactivity to 2 different antibodies that can be seen with a prior infection to Lyme disease but is not indicative of  active lyme disease. I am unsure if he has ever been treated for Lyme disease in the past. I would like to send him to Cardiology for further evaluation of symptoms"  Patient states he has had a low-grade fever of 99.1 F for weeks and that he has "every symptom of lyme disease." This RN read the following from Alto Atta, NP:  "He can still have symptoms even though it is not active this is true. I am going to treat with a 10 day course of doxycycline. If this is lyme disease it can still have symptoms even after antibiotics. Ruling out a cardio issue is needed"  Patient states understanding and all questions answered at this time. Reason for Disposition . Health Information question, no triage required and triager able to answer question  Answer Assessment - Initial Assessment Questions 1. REASON FOR CALL or QUESTION: "What is your reason for calling today?" or "How can I best help you?" or "What question do you have that I can help answer?"     Antibiotic question  Protocols used: Information Only Call - No Triage-A-AH

## 2024-01-10 NOTE — Progress Notes (Signed)
 Called pt again and it goes to vm.

## 2024-01-10 NOTE — Telephone Encounter (Signed)
 Called pt to go over last note under lab review. Pt had a question. Please see result note on the reason I called pt. Have been unable to reach him.

## 2024-01-10 NOTE — Telephone Encounter (Signed)
 Noted.

## 2024-03-20 DIAGNOSIS — M5412 Radiculopathy, cervical region: Secondary | ICD-10-CM | POA: Diagnosis not present

## 2024-03-20 DIAGNOSIS — M48062 Spinal stenosis, lumbar region with neurogenic claudication: Secondary | ICD-10-CM | POA: Diagnosis not present

## 2024-03-20 DIAGNOSIS — M4802 Spinal stenosis, cervical region: Secondary | ICD-10-CM | POA: Diagnosis not present

## 2024-03-20 DIAGNOSIS — M542 Cervicalgia: Secondary | ICD-10-CM | POA: Diagnosis not present

## 2024-03-20 DIAGNOSIS — M5416 Radiculopathy, lumbar region: Secondary | ICD-10-CM | POA: Diagnosis not present

## 2024-05-06 DIAGNOSIS — Z1384 Encounter for screening for dental disorders: Secondary | ICD-10-CM | POA: Diagnosis not present

## 2024-05-14 DIAGNOSIS — M48062 Spinal stenosis, lumbar region with neurogenic claudication: Secondary | ICD-10-CM | POA: Diagnosis not present

## 2024-06-19 ENCOUNTER — Ambulatory Visit: Payer: Self-pay

## 2024-06-19 NOTE — Telephone Encounter (Signed)
 FYI Only or Action Required?: FYI only for provider.  Patient was last seen in primary care on 01/02/2024 by Merna Huxley, NP.  Called Nurse Triage reporting Palpitations and Referral.  Symptoms began several months ago.  Interventions attempted: Rest, hydration, or home remedies.  Symptoms are: unchanged.  Triage Disposition: Call Specialist When Office Open, See PCP Within 2 Weeks  Patient/caregiver understands and will follow disposition?: Yes           Copied from CRM [id number]. Topic: Clinical - Red Word Triage >> Jun 19, 2024  4:56 PM Lauren C wrote: Red Word that prompted transfer to Nurse Triage: Palpitations, fast heartbeat mostly on exertion. Thinks it is related to being diagnosed with Lyme 6 mos ago. He states he spoke with Huxley about this previously and a cardiology referral was considered. I will put in referral request. Reason for Disposition  Palpitations are a chronic symptom (recurrent or ongoing AND present > 4 weeks)    Pt has hx of chronic palpitations and requesting PCP for cardiology referral. Upon further investigation, referral was already placed 12/2023. Pt reports that he had a new number around that time and realized his profile was not updated with new number.  Triager updated number on file. Triager also advised him to call specialists tomorrow AM since he prefers to get evaluation from specialist.  Answer Assessment - Initial Assessment Questions 1. DESCRIPTION: Please describe your heart rate or heartbeat that you are having (e.g., fast/slow, regular/irregular, skipped or extra beats, palpitations)     Palpitations with exertion - rapid beating 2. ONSET: When did it start? (e.g., minutes, hours, days)      Ever since he was dx with lyme disease 3. DURATION: How long does it last (e.g., seconds, minutes, hours)     Several minutes 4. PATTERN Does it come and go, or has it been constant since it started?  Does it get worse with  exertion?   Are you feeling it now?     Comes and goes 5. TAP: Using your hand, can you tap out what you are feeling on a chair or table in front of you, so that I can hear? Note: Not all patients can do this.       N/a 6. HEART RATE: Can you tell me your heart rate? How many beats in 15 seconds?  Note: Not all patients can do this.       At least 120 BPM during event; currently at 92 7. RECURRENT SYMPTOM: Have you ever had this before? If Yes, ask: When was the last time? and What happened that time?      Yes, has discussed with PCP and suggested cardiology referral 8. CAUSE: What do you think is causing the palpitations?     Lyme disease 9. CARDIAC HISTORY: Do you have any history of heart disease? (e.g., heart attack, angina, bypass surgery, angioplasty, arrhythmia)      Hx of HTN in family 10. OTHER SYMPTOMS: Do you have any other symptoms? (e.g., dizziness, chest pain, sweating, difficulty breathing)       Dizziness, sweating, SOB during events 11. PREGNANCY: Is there any chance you are pregnant? When was your last menstrual period?       N/a  Protocols used: Heart Rate and Heartbeat Questions-A-AH

## 2024-06-23 ENCOUNTER — Ambulatory Visit: Admitting: Nurse Practitioner

## 2024-06-23 NOTE — Progress Notes (Deleted)
{  This patient may be at risk for Amyloid. He has one or more dx on the prob list or PMH from the following list -  Abnormal EKG, HFpEF/Diastolic CHF, Aortic Stenosis, LVH, Bilateral Carpal Tunnel Syndrome, Biceps Tendon Rupture, Spinal Stenosis, Pericardial Effusion, Left Atrial Enlargement, Conduction System Disorder. See list below or review PMH.  Diagnoses From Problem List           Noted     Spinal stenosis of lumbar region 02/17/2022    Click HERE to open Cardiac Amyloid Screening SmartSet to order screening OR Click HERE to defer testing for 1 year or permanently :1}     OFFICE NOTE:    Date:  06/23/2024  ID:  Carlos Grant, DOB [birth year], MRN [id number] PCP: Merna Huxley, NP  [facility] Providers Cardiologist:  None { Click to update primary MD,subspecialty MD or APP then REFRESH:1}       - Lumbar spine stenosis - Left hip replacement - Right hip arthritis - Former smoker        Discussed the use of AI scribe software for clinical note transcription with the patient, who gave verbal consent to proceed. History of Present Illness Carlos Grant is a 63 y.o. male referred by Nafziger, Cory, NP for evaluation of palpitations, shortness of breath. He was seen by primary care in May 2025 for shortness of breath and palpitations, particularly with exertion, and exercise intolerance. Lyme antibody tests were found to be abnormal, and he was treated with doxycycline .     ROS-See HPI***    Studies Reviewed:       LABS Potassium: 4.1 (01/02/2024) Creatinine: 0.91 (01/02/2024) eGFR: 91.68 (01/02/2024) ALT: 12 (01/02/2024) Hemoglobin: 15.6 (01/02/2024) Platelet count: 245,000 (01/02/2024) TSH: 3.29 (01/02/2024) Results  Risk Assessment/Calculations: {Does this patient have ATRIAL FIBRILLATION?:[id number]} No BP recorded.  {Refresh Note OR Click here to enter BP  :1}***      Physical Exam:  VS:  There were no vitals taken for this visit.       Wt  Readings from Last 3 Encounters:  01/02/24 164 lb (74.4 kg)  02/01/22 160 lb (72.6 kg)  10/21/21 168 lb (76.2 kg)    Physical Exam***     Assessment and Plan:    Assessment & Plan  Assessment and Plan Assessment & Plan    {      :1}    {Are you ordering a CV Procedure (e.g. stress test, cath, DCCV, TEE, etc)?   Press F2        :[id number]}  Dispo:  No follow-ups on file.  Signed, Glendia Ferrier, PA-C

## 2024-06-24 ENCOUNTER — Ambulatory Visit: Admitting: Physician Assistant

## 2024-06-24 DIAGNOSIS — R002 Palpitations: Secondary | ICD-10-CM

## 2024-06-24 DIAGNOSIS — R0602 Shortness of breath: Secondary | ICD-10-CM

## 2024-07-03 ENCOUNTER — Telehealth: Payer: Self-pay

## 2024-07-03 NOTE — Telephone Encounter (Signed)
 Patient left voice mail wanting to schedule right THA.  Do you want to see him again in the office?

## 2024-07-07 NOTE — Progress Notes (Signed)
 [facility] 07/07/24 No primary care provider on file. Treatment Providers Amy Squier, [facility]   Dental procedures in this visit  . I5644 - DEBRIDEMENT TO ENABLE A COMPREHENSIVE PERIODONTAL EVAL AND DIAGNOSIS ON A SUBSEQUENT VISIT (Completed)    Service provider: Greig Manifold, RDH    Billing provider: Selinda Becton, DDS  . D0150 - COMPREHENSIVE ORAL EVALUATION - NEW OR ESTABLISHED PATIENT  . I0000 - PERIO PROBING    HEALTH HISTORY ? Vitals:  BP Readings from Last 1 Encounters:  07/07/24 (!) 156/93    Pulse:  78 Medical history was reviewed and updated. No contraindication to care. Medical History[1] Surgical History[2] Social History[3] Family History[4] Medications Ordered Prior to Encounter[5] Medical Risk Assessment ASA GRADE: ASA 2 - Patient with mild systemic disease with no functional limitations  Subjective: Mr.  Korry Dalgleish is a 63 y.o. male who presents for initial consultation with the chief concern (CC): Broken #8 History of presenting condition: Many years ?Pain score currently:  0/10  Objective:  Radiographs taken: FMX and Panorex taken at last appointment  All Radiographs Entry:    ROS: all other systems negative for symptoms Extraoral Examination? Findings: wnl Patient is well appearing, alert and oriented to time and place.  Patient appeared well nourished and in no distress.  There was no facial asymmetry, swelling or lymphadenopathy.  There was no hoarseness.  The temporomandibular joint examination was within normal limits without tenderness.  Palpation of the masseter, temporalis, sternocleidomastoid and trapezius muscles was within normal limits.  Muscles of facial expression within normal limits.  There was normal mouth opening and range of motion.  The skin of the head and neck was within normal limits.   Intraoral Examination? Findings: WNL There were no mucosal lesions or swelling of the oropharynx, hard palate, soft  palate, buccal mucosa, lip mucosa, gingiva, tongue, or floor of mouth.  Major salivary glands palpated and ducts were patent.  There was adequate saliva with moist mucosa and floor of mouth pooling.   Periodontal Findings Periodontal Charting: Updated full mouth periodontal charting and documented all findings in patient's tooth chart: Yes Generalized probing depths, 4-25mm No BOP noted No exudate present Generalized recession noted Furcation involvements: None Mobility:  Class II on #9 Calculus: Heavy subgingival plaque and calculus Color- Red Consistency: Localized rolled and erthymatous margins Radiographic studies shows moderate generalized horizontal bone loss   Dental Findings Dental Exam   Extractions with SRP appointments. Will extract #9 at partial delivery   Assessment: Diagnosis: After thorough clinical and radiographic examination, the patient was advised of the following diagnosis:  Caries, nonrestorable, Generalized periodontal disease, and Fractured tooth, nonrestorable   Risks and potential complications: None. Periodontal diagnosis: Stage IV, Grade B Additional notes: Patient is a previous smoker Additional Treatment Rendered: Full mouth debridement done today. Cavitron used to remove supragingival calculus around lower anteriors  Plan: Treatment options related to findings reviewed and all questions from patient answered.  Prescribed the following medications: PRESCRIPTION: none First visit patient will return for: SRP with extractions  4. Impressions for acrylic P/ to replace 2,3,4,8,9,14,15.  #9 to be ext'd at delivery of P/   Treatment Providers Amy Squier, RDH, [facility] Dr. Jason Caputo, DDS  [facility] [address] [place] [address] [phone]          [1] History reviewed. No pertinent past medical history. [2] Past Surgical History: Procedure Laterality Date  . HIP  SURGERY    [  3] Social History Tobacco Use  . Smoking status: Former    Types: Cigarettes  . Smokeless tobacco: Never  Substance Use Topics  . Alcohol use: Yes    Alcohol/week: 2.0 standard drinks of alcohol    Types: 2 Cans of beer per week  . Drug use: Never  [4] Family History Problem Relation Name Age of Onset  . No Known Problems Mother    . No Known Problems Father    [5] Current Outpatient Medications on File Prior to Visit  Medication Sig Dispense Refill  . ibuprofen (ADVIL,MOTRIN) 200 mg tablet Take 1,400 mg by mouth.    SABRA ibuprofen, bulk, 100 % powder Take 200 m by mouth.     No current facility-administered medications on file prior to visit.

## 2024-07-08 NOTE — Telephone Encounter (Signed)
 Patient called back.  He has some questions for Dr. Vernetta before scheduling.  I made an appointment for him to come in.

## 2024-07-08 NOTE — Telephone Encounter (Signed)
 I called patient and no answer with full voice mail.  Could not leave message.  Will try again.

## 2024-07-09 ENCOUNTER — Ambulatory Visit (INDEPENDENT_AMBULATORY_CARE_PROVIDER_SITE_OTHER)

## 2024-07-09 ENCOUNTER — Ambulatory Visit: Attending: Physician Assistant | Admitting: Physician Assistant

## 2024-07-09 ENCOUNTER — Encounter: Payer: Self-pay | Admitting: Physician Assistant

## 2024-07-09 VITALS — BP 160/100 | HR 82 | Ht 66.5 in | Wt 168.6 lb

## 2024-07-09 DIAGNOSIS — R030 Elevated blood-pressure reading, without diagnosis of hypertension: Secondary | ICD-10-CM | POA: Diagnosis not present

## 2024-07-09 DIAGNOSIS — R5383 Other fatigue: Secondary | ICD-10-CM | POA: Insufficient documentation

## 2024-07-09 DIAGNOSIS — R002 Palpitations: Secondary | ICD-10-CM | POA: Insufficient documentation

## 2024-07-09 DIAGNOSIS — R0602 Shortness of breath: Secondary | ICD-10-CM | POA: Insufficient documentation

## 2024-07-09 NOTE — Progress Notes (Signed)
 Cardiology Office Note   Date:  07/10/2024  ID:  Carlos Grant, DOB 1961/04/30, MRN [id number] PCP: Merna Huxley, NP  [facility] Providers Cardiologist:  [facility] - DOD Dr. Kriste  History of Present Illness Carlos Grant is a 63 y.o. male with past medical history of lumbar spinal stenosis, former smoker and left hip replacement. Patient was referred to cardiology service for evaluation of palpitation and shortness of breath.  He was seen in May 2025 by PCP for shortness of breath and palpitation, particularly with exertion and exercise intolerance.  He also complained of intermittent fever and swelling.  Lyme antibody test was found to be abnormal and he was treated with doxycycline .  Chest x-ray was normal.  TSH, CBC, CRP, sed rate were normal.  Two of the Lyme disease antibody came back positive, however it was not clear if this represented a previous infection.  He has been followed by Dr. Colon of neurosurgery.  He has been found to have cervical myelopathy.  Dr. Colon has previously recommended C3-7 ACDF however he could not undergo MRI due to severe claustrophobia.  He could not tolerate pregabalin due to significant side effect of dizziness in the central nervous system disturbance.  He declined the Neurontin.  Patient presents today for evaluation of dyspnea on exertion.  He adamantly denies any chest pain.  Dyspnea on exertion has been going on for a while.  However he is not very active due to his severe back issue.  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  This limited his mobility and likely have led to some degree of deconditioning.  On physical exam, he also has significant wheezing in both lung.  He smoked for 20 years however quit in 2013.  He has never been diagnosed with COPD in the past.  I suspect he has some degree of lung disease as well, therefore I have ordered pulmonary function test.  I recommend a echocardiogram to  establish a baseline cardiac function.  Last week, while climbing ladders, he had sudden onset of palpitation that lasted several minutes.  The palpitation was accompanied by severe dizziness.  He says he usually gets this type of tachypalpitations about once a week and often will self terminate after several minutes.  I recommended a heart monitor to make sure he is not going into A-fib.  I plan to see the patient back in 6 to 8 weeks.  If pulmonary function test came back abnormal, I plan to refer the patient to pulmonology service.  ROS:   Patient complains of palpitation and the dyspnea on exertion.  He denies any chest pain.  He has severe back issue.  He has no lower extremity edema, orthopnea or PND  Studies Reviewed EKG Interpretation Date/Time:  Wednesday July 09 2024 13:23:17 EST Ventricular Rate:  82 PR Interval:  144 QRS Duration:  84 QT Interval:  356 QTC Calculation: 415 R Axis:   55  Text Interpretation: Normal sinus rhythm with sinus arrhythmia Normal ECG No previous ECGs available Confirmed by Janene Boer [phone]) on 07/09/2024 1:51:30 PM        Risk Assessment/Calculations          Physical Exam VS:  BP (!) 160/100   Pulse 82   Ht 5' 6.5 (1.689 m)   Wt 168 lb 9.6 oz (76.5 kg)   SpO2 97%   BMI 26.81 kg/m        Wt Readings from Last  3 Encounters:  07/09/24 168 lb 9.6 oz (76.5 kg)  01/02/24 164 lb (74.4 kg)  02/01/22 160 lb (72.6 kg)    GEN: Well nourished, well developed in no acute distress NECK: No JVD; No carotid bruits CARDIAC: RRR, no murmurs, rubs, gallops RESPIRATORY:  Clear to auscultation without rales, wheezing or rhonchi  ABDOMEN: Soft, non-tender, non-distended EXTREMITIES:  No edema; No deformity   ASSESSMENT AND PLAN  Dyspnea on exertion: I still suspect some of his shortness of breath with exertion is probably due to combination of deconditioning from severe back issue and baseline lung issue.  Will obtain echocardiogram and PFT.  He  has wheezing on exam.  If PFT is abnormal, plan to refer the patient to pulmonology service  Tachypalpitation: Obtain 2-week ZIO monitor  Elevated blood pressure: He has significant pain in the office today, blood pressure is elevated.  His blood pressure was previously normal during the recent office visit with PCP.       Dispo: Follow-up in 6 to 8 weeks  Signed, Jacobi Nile, [place]

## 2024-07-09 NOTE — Patient Instructions (Signed)
 Medication Instructions:  Your physician recommends that you continue on your current medications as directed. Please refer to the Current Medication list given to you today.  *If you need a refill on your cardiac medications before your next appointment, please call your pharmacy*  Lab Work: None ordered  If you have labs (blood work) drawn today and your tests are completely normal, you will receive your results only by: MyChart Message (if you have MyChart) OR A paper copy in the mail If you have any lab test that is abnormal or we need to change your treatment, we will call you to review the results.  Testing/Procedures: Your physician has requested that you have an echocardiogram. Echocardiography is a painless test that uses sound waves to create images of your heart. It provides your doctor with information about the size and shape of your heart and how well your heart's chambers and valves are working. This procedure takes approximately one hour. There are no restrictions for this procedure. Please do NOT wear cologne, perfume, aftershave, or lotions (deodorant is allowed). Please arrive 15 minutes prior to your appointment time.  Please note: We ask at that you not bring children with you during ultrasound (echo/ vascular) testing. Due to room size and safety concerns, children are not allowed in the ultrasound rooms during exams. Our front office staff cannot provide observation of children in our lobby area while testing is being conducted. An adult accompanying a patient to their appointment will only be allowed in the ultrasound room at the discretion of the ultrasound technician under special circumstances. We apologize for any inconvenience.  Your physician has recommended that you have a pulmonary function test. Pulmonary Function Tests are a group of tests that measure how well air moves in and out of your lungs.    ZIO XT- Long Term Monitor Instructions  Your physician has  requested you wear a ZIO patch monitor for 14 days.  This is a single patch monitor. [employer] supplies one patch monitor per enrollment. Additional stickers are not available. Please do not apply patch if you will be having a Nuclear Stress Test,  Echocardiogram, Cardiac CT, MRI, or Chest Xray during the period you would be wearing the  monitor. The patch cannot be worn during these tests. You cannot remove and re-apply the  ZIO XT patch monitor.  Your ZIO patch monitor will be mailed 3 day USPS to your address on file. It may take 3-5 days  to receive your monitor after you have been enrolled.  Once you have received your monitor, please review the enclosed instructions. Your monitor  has already been registered assigning a specific monitor serial # to you.  Billing and Patient Assistance Program Information  We have supplied [employer] with any of your insurance information on file for billing purposes. [employer] offers a sliding scale Patient Assistance Program for patients that do not have  insurance, or whose insurance does not completely cover the cost of the ZIO monitor.  You must apply for the Patient Assistance Program to qualify for this discounted rate.  To apply, please call [employer] at [phone], select option 4, select option 2, ask to apply for  Patient Assistance Program. Meredeth will ask your household income, and how many people  are in your household. They will quote your out-of-pocket cost based on that information.  [employer] will also be able to set up a 11-month, interest-free payment plan if needed.  Applying the monitor   Shave hair from upper  left chest.  Hold abrader disc by orange tab. Rub abrader in 40 strokes over the upper left chest as  indicated in your monitor instructions.  Clean area with 4 enclosed alcohol pads. Let dry.  Apply patch as indicated in monitor instructions. Patch will be placed under collarbone on left  side of chest with arrow pointing  upward.  Rub patch adhesive wings for 2 minutes. Remove white label marked 1. Remove the white  label marked 2. Rub patch adhesive wings for 2 additional minutes.  While looking in a mirror, press and release button in center of patch. A small green light will  flash 3-4 times. This will be your only indicator that the monitor has been turned on.  Do not shower for the first 24 hours. You may shower after the first 24 hours.  Press the button if you feel a symptom. You will hear a small click. Record Date, Time and  Symptom in the Patient Logbook.  When you are ready to remove the patch, follow instructions on the last 2 pages of Patient  Logbook. Stick patch monitor onto the last page of Patient Logbook.  Place Patient Logbook in the blue and white box. Use locking tab on box and tape box closed  securely. The blue and white box has prepaid postage on it. Please place it in the mailbox as  soon as possible. Your physician should have your test results approximately 7 days after the  monitor has been mailed back to [facility].  Call [facility] Customer Care at [phone] if you have questions regarding  your ZIO XT patch monitor. Call them immediately if you see an orange light blinking on your  monitor.  If your monitor falls off in less than 4 days, contact our Monitor department at [phone].  If your monitor becomes loose or falls off after 4 days call [employer] at [phone] for  suggestions on securing your monitor   Follow-Up: At [facility], you and your health needs are our priority.  As part of our continuing mission to provide you with exceptional heart care, our providers are all part of one team.  This team includes your primary Cardiologist (physician) and Advanced Practice Providers or APPs (Physician Assistants and Nurse Practitioners) who all work together to provide you with the care you need, when you need it.  Your next appointment:   6-8   week(s)  Provider:   Scot Ford, PA-C          We recommend signing up for the patient portal called MyChart.  Sign up information is provided on this After Visit Summary.  MyChart is used to connect with patients for Virtual Visits (Telemedicine).  Patients are able to view lab/test results, encounter notes, upcoming appointments, etc.  Non-urgent messages can be sent to your provider as well.   To learn more about what you can do with MyChart, go to [email or web address].   Other Instructions

## 2024-07-09 NOTE — Progress Notes (Unsigned)
 Enrolled for [employer] to mail a ZIO XT long term holter monitor to the patients address on file.   Dr. Kriste to read.

## 2024-07-10 ENCOUNTER — Ambulatory Visit (INDEPENDENT_AMBULATORY_CARE_PROVIDER_SITE_OTHER): Admitting: Orthopaedic Surgery

## 2024-07-10 DIAGNOSIS — M1611 Unilateral primary osteoarthritis, right hip: Secondary | ICD-10-CM | POA: Diagnosis not present

## 2024-07-10 DIAGNOSIS — M25551 Pain in right hip: Secondary | ICD-10-CM | POA: Diagnosis not present

## 2024-07-10 NOTE — Progress Notes (Signed)
 The patient is well-known to us .  He is an active 63 year old gentleman who underwent a successful left total hip arthroplasty back in February 2023 for severe end-stage bone-on-bone arthritis.  Over the last few years she has developed worsening right hip pain.  3 years ago his right hip exam was normal and the x-rays showed well-maintained joint space.  X-rays are this year of the pelvis and right hip showed a complete loss of joint space with bone-on-bone wear as well as sclerotic changes, flattening of the femoral head, cystic changes and osteophytes.  The joint space is now completely gone.  The left hip is done very well.  At this point his pain is 10 out of 10 and quite severe with his right hip.  It is detrimentally affecting his mobility, his quality of life and his actives daily living.  He does to have severe lumbar stenosis as well.  On exam he walks with a Trendelenburg gait.  His left hip is incredibly stiff with any attempts of rotation and it shows severe pain.  His left operative hip moves smoothly and fluidly.  We did go over the x-rays again from 3 years ago and this year showing the worsening quite severely and significantly of his right hip arthritis.  At this point there are no conservative treatment measures that we will help.  Knees not obese.  He is taken the full allotment of Tylenol  and ibuprofen that someone can safely take.  Therapy is not going to help at all in terms of PT and injections are no longer relevant or helpful given the complete loss of joint space.  He is someone who does have a lot of anxiety and even upon discharge will likely need some Valium  for being able to get into an elevator due to severe claustrophobia.  Even at the time of surgery we would have them just perform an In-N-Out catheterization at the time of surgery.  We will work on getting him scheduled soon as we can for a right total hip arthroplasty.

## 2024-07-14 ENCOUNTER — Telehealth (HOSPITAL_BASED_OUTPATIENT_CLINIC_OR_DEPARTMENT_OTHER): Payer: Self-pay

## 2024-07-14 NOTE — Telephone Encounter (Signed)
   Pre-operative Risk Assessment    Patient Name: Carlos Grant  DOB: Mar 12, 1961 MRN: [id number]   Date of last office visit: 07/09/2024 with Scot Ford, PA Date of next office visit: 08/27/2024 with Scot Ford, PA  Request for Surgical Clearance    Procedure:  Right total hip arthroplasty  Date of Surgery:  Clearance 08/29/24                                 Surgeon:  Dr. Lonni Poli Surgeon's Group or Practice Name:  [facility]  Phone number:  [phone] Fax number:  [phone]   Type of Clearance Requested:   - Medical    Type of Anesthesia:  Not specified   Additional requests/questions:  None  Signed, Patrcia Iverson CROME   07/14/2024, 12:34 PM

## 2024-08-06 ENCOUNTER — Ambulatory Visit: Payer: Self-pay | Admitting: Physician Assistant

## 2024-08-06 DIAGNOSIS — R0602 Shortness of breath: Secondary | ICD-10-CM | POA: Diagnosis not present

## 2024-08-06 DIAGNOSIS — R002 Palpitations: Secondary | ICD-10-CM | POA: Diagnosis not present

## 2024-08-06 DIAGNOSIS — R5383 Other fatigue: Secondary | ICD-10-CM | POA: Diagnosis not present

## 2024-08-06 NOTE — Telephone Encounter (Signed)
  Patient is returning call to get result

## 2024-08-07 ENCOUNTER — Ambulatory Visit (HOSPITAL_COMMUNITY): Admission: RE | Admit: 2024-08-07 | Source: Ambulatory Visit

## 2024-08-12 ENCOUNTER — Telehealth: Payer: Self-pay | Admitting: Physician Assistant

## 2024-08-12 NOTE — Telephone Encounter (Signed)
 Anyway to do the echo prior to the surgery? It appears his surgeon sent us  a cardiac clearance request since the last visit. If a cardiac clearance is needed, then we need the echo prior to the procedure.

## 2024-08-12 NOTE — Telephone Encounter (Signed)
 Called pt rescheduled Echo to 08/13/24 at 2:15 pm.  Advised pt to be here by 2 pm.  Pt expresses understanding. All questions answered.

## 2024-08-12 NOTE — Telephone Encounter (Signed)
 Patient is asking if not having the echo performed will keep him from having his surgery planned for 08/29/24. He states he has everything arranged to be able to go forward with the surgery and is hopeful this will not delay things.  Informed patient I will forward message to Hao to review and let us  know if he must have echo prior to procedure scheduled for January. Patient also has appt with Hao on 08/27/24.

## 2024-08-12 NOTE — Telephone Encounter (Signed)
 Pt would like to know if he's going to prolong his surgery since he didn't have his echo done.

## 2024-08-13 ENCOUNTER — Other Ambulatory Visit (HOSPITAL_COMMUNITY): Payer: Self-pay | Admitting: Physician Assistant

## 2024-08-13 ENCOUNTER — Ambulatory Visit (HOSPITAL_COMMUNITY): Admission: RE | Admit: 2024-08-13 | Discharge: 2024-08-13 | Attending: Cardiology | Admitting: Cardiology

## 2024-08-13 DIAGNOSIS — R0609 Other forms of dyspnea: Secondary | ICD-10-CM | POA: Diagnosis present

## 2024-08-13 LAB — ECHOCARDIOGRAM COMPLETE
Area-P 1/2: 4.06 cm2
S' Lateral: 2.6 cm

## 2024-08-13 MED ADMIN — Perflutren Lipid Microsphere IV Susp 1.1 MG/ML: 2 mL | INTRAVENOUS | @ 15:00:00 | NDC 99999100031

## 2024-08-13 NOTE — Telephone Encounter (Signed)
 Helping in preop today. Patient pending preop clearance for hip surgery - Hao, can you let us  know when you review echo what your recommendations are? Also had completion of monitor with plan to discuss in follow-up. You also see the patient back 12/31, surgery scheduled 1/2. Let us  know if you feel this requires to push out.

## 2024-08-14 ENCOUNTER — Ambulatory Visit: Payer: Self-pay | Admitting: Physician Assistant

## 2024-08-14 NOTE — Progress Notes (Signed)
 Sent message, via epic in basket, requesting orders in epic from Careers adviser.

## 2024-08-14 NOTE — Telephone Encounter (Signed)
° °  Patient Name: Carlos Grant  DOB: Dec 07, 1960 MRN: [id number]  Primary Cardiologist: Emeline FORBES Calender, DO  Chart reviewed as part of pre-operative protocol coverage. Given past medical history and time since last visit, based on ACC/AHA guidelines, EADEN HETTINGER is at acceptable risk for the planned procedure without further cardiovascular testing.  Patient recently underwent echocardiogram which showed normal ejection fraction, mildly elevated right chamber pressure.  Suspect patient likely has undiagnosed underlying COPD as a former smoker.  Will need to closely monitor his breathing at the time of surgery.  He does not have a limitation from the cardiac perspective to proceed with upcoming procedure.  I will route this recommendation to the requesting party via Epic fax function and remove from pre-op pool.  Please call with questions.  Joceline Hinchcliff, [place] 08/14/2024, 5:20 PM

## 2024-08-15 ENCOUNTER — Encounter: Payer: Self-pay | Admitting: Physician Assistant

## 2024-08-15 NOTE — Progress Notes (Signed)
 COVID Vaccine received:  [x]  No []  Yes Date of any COVID positive Test in last 37 days:None  PCP - Darleene Shape, NP  Cardiologist - [name], DO,  [place], [place] cardiac clearance in 08-14-24 Phone note    Chest x-ray - 01-02-2024 2v Epic EKG - 07-09-2024  Epic  Stress Test -  ECHO - 08-13-24  Epic Cardiac Cath -  CT Coronary Calcium score:  Zio Monitor- 08-06-2024  Epic  Pacemaker / ICD device [x]  No []  Yes   Spinal Cord Stimulator:[x]  No []  Yes       History of Sleep Apnea? [x]  No []  Yes   CPAP used?- [x]  No []  Yes    Medication on DOS:  none  Patient has: [x]  NO Hx DM   []  Pre-DM   []  DM1  []   DM2 Does the patient monitor blood sugar?   [x]  N/A   []  No []  Yes   Blood Thinner / Instructions:  none Aspirin  Instructions:  none  Activity level: Able to walk up 2 flights of stairs without becoming significantly short of breath or having chest pain?   [x]    Yes   []  No,  would have:  Patient can perform ADLs without assistance.  [x]   Yes    []  No   Comments: Patient wants the same anesthesia plan as was done in 2023 with his Left THA.   Anesthesia review: new dx of COPD, Claustrophobic - will not ride elevators, etc  Patient denies any S&S of respiratory illness or Covid - no shortness of breath, fever, cough or chest pain at PAT appointment.  Patient verbalized understanding and agreement to the Pre-Surgical Instructions that were given to them at this PAT appointment. Patient was also educated of the need to review these PAT instructions again prior to his surgery.I reviewed the appropriate phone numbers to call if they have any and questions or concerns.

## 2024-08-15 NOTE — Patient Instructions (Addendum)
 SURGICAL WAITING ROOM VISITATION Patients having surgery or a procedure may have no more than 2 support people in the waiting area - these visitors may rotate in the visitor waiting room.   If the patient needs to stay at the hospital during part of their recovery, the visitor guidelines for inpatient rooms apply.  PRE-OP VISITATION  Pre-op nurse will coordinate an appropriate time for 1 support person to accompany the patient in pre-op.  This support person may not rotate.  This visitor will be contacted when the time is appropriate for the visitor to come back in the pre-op area.  Please refer to the [facility] website for the visitor guidelines for Inpatients (after your surgery is over and you are in a regular room).  Temporary Visitor Restrictions  Children ages 10 and under will not be able to visit patients in [facility] under most circumstances. Visitation is not restricted outside of hospitals unless noted otherwise in the [facility] and Location Specific Visitation Guidelines at :      [email or web address] Visitors with respiratory illnesses are discouraged from visiting and should remain at home.  You are not required to quarantine at this time prior to your surgery. However, you must do this: Hand Hygiene often Do NOT share personal items Notify your provider if you are in close contact with someone who has COVID or you develop fever 100.4 or greater, new onset of sneezing, cough, sore throat, shortness of breath or body aches.  If you test positive for Covid or have been in contact with anyone that has tested positive in the last 10 days please notify you surgeon.    Your procedure is scheduled on: FRIDAY  08-29-2024   Report to [facility] Main Entrance: [address] entrance where the [employer] is available.   Report to admitting at:  06:15   AM  Call this number if you have any questions or problems the morning of surgery [phone]  Do not eat food  after Midnight the night prior to your surgery/procedure.  After Midnight you may have the following liquids until    05:45 AM DAY OF SURGERY  Clear Liquid Diet Water  Black Coffee (sugar ok, NO MILK/CREAM OR CREAMERS)  Tea (sugar ok, NO MILK/CREAM OR CREAMERS) regular and decaf                             Plain Jell-O  with no fruit (NO RED)                                           Fruit ices (not with fruit pulp, NO RED)                                     Popsicles (NO RED)                                                                  Juice: NO CITRUS JUICES: only apple, WHITE grape, WHITE cranberry Sports drinks like Gatorade or Powerade (NO RED)  FOLLOW ANY ADDITIONAL PRE OP INSTRUCTIONS YOU RECEIVED FROM YOUR SURGEON'S OFFICE!!!   Oral Hygiene is also important to reduce your risk of infection.        Remember - BRUSH YOUR TEETH THE MORNING OF SURGERY WITH YOUR REGULAR TOOTHPASTE  Do NOT smoke after Midnight the night before surgery.  STOP TAKING all Vitamins, Herbs and supplements 1 week before your surgery.   Take ONLY these medicines the morning of surgery with A SIP OF WATER :  none                   You may not have any metal on your body including  jewelry, and body piercing  Do not wear  lotions, powders, cologne, or deodorant  Men may shave face and neck.  Contacts, Hearing Aids, dentures or bridgework may not be worn into surgery. DENTURES WILL BE REMOVED PRIOR TO SURGERY PLEASE DO NOT APPLY Poly grip OR ADHESIVES!!!  You may bring a small overnight bag with you on the day of surgery, only pack items that are not valuable.  IS NOT RESPONSIBLE   FOR VALUABLES THAT ARE LOST OR STOLEN.   Do not bring your home medications to the hospital. The Pharmacy will dispense medications listed on your medication list to you during your admission in the Hospital.  Please read over the following fact sheets you were given: IF YOU HAVE QUESTIONS  ABOUT YOUR PRE-OP INSTRUCTIONS, PLEASE CALL [phone].      Pre-operative 4 CHG Bath Instructions   You can play a key role in reducing the risk of infection after surgery. Your skin needs to be as free of germs as possible. You can reduce the number of germs on your skin by washing with CHG (chlorhexidine  gluconate) soap before surgery. CHG is an antiseptic soap that kills germs and continues to kill germs even after washing.   DO NOT use if you have an allergy to chlorhexidine /CHG or antibacterial soaps. If your skin becomes reddened or irritated, stop using the CHG and notify one of our RNs at [phone]  Please shower with the CHG soap starting 4 days before surgery using the following schedule:    [facility]  08-25-2024     Do NOT use CHG soap                                                                                                                      the morning of your  surgery.         Please keep in mind the following:  DO NOT shave, including legs and underarms, starting the day of your first shower.   You may shave your face at any point before/day of surgery.  Place clean sheets on your bed the day you start using CHG soap. Use a clean washcloth (not used since being washed) for each shower. DO NOT sleep with pets once you start using the CHG.  CHG Shower Instructions:  If you choose to wash your hair and private area, wash first with your normal shampoo/soap.  After you use shampoo/soap, rinse your hair and body thoroughly to remove shampoo/soap residue.  Turn the water  OFF and apply about 3 tablespoons (45 ml) of CHG soap to a CLEAN washcloth.  Apply CHG soap ONLY FROM YOUR NECK DOWN TO YOUR TOES (washing for 3-5 minutes)  DO NOT use CHG soap on face, private areas, open wounds, or sores.  Pay special attention to the area where your  surgery is being performed.  If you are having back surgery, having someone wash your back for you may be helpful. Wait 2 minutes after CHG soap is applied, then you may rinse off the CHG soap.  Pat dry with a clean towel  Put on clean clothes/pajamas   If you choose to wear lotion, please use ONLY the CHG-compatible lotions on the back of this paper.     Additional instructions for the day of surgery: DO NOT APPLY any CHG Soap,  lotions, deodorants, cologne, or perfumes on the day of surgery  Put on clean/comfortable clothes.  Brush your teeth.  Ask your nurse before applying any prescription medications to the skin.   CHG Compatible Lotions   Aveeno Moisturizing lotion  Cetaphil Moisturizing Cream  Cetaphil Moisturizing Lotion  Clairol Herbal Essence Moisturizing Lotion, Dry Skin  Clairol Herbal Essence Moisturizing Lotion, Extra Dry Skin  Clairol Herbal Essence Moisturizing Lotion, Normal Skin  Curel Age Defying Therapeutic Moisturizing Lotion with Alpha Hydroxy  Curel Extreme Care Body Lotion  Curel Soothing Hands Moisturizing Hand Lotion  Curel Therapeutic Moisturizing Cream, Fragrance-Free  Curel Therapeutic Moisturizing Lotion, Fragrance-Free  Curel Therapeutic Moisturizing Lotion, Original Formula  Eucerin Daily Replenishing Lotion  Eucerin Dry Skin Therapy Plus Alpha Hydroxy Crme  Eucerin Dry Skin Therapy Plus Alpha Hydroxy Lotion  Eucerin Original Crme  Eucerin Original Lotion  Eucerin Plus Crme Eucerin Plus Lotion  Eucerin TriLipid Replenishing Lotion  Keri Anti-Bacterial Hand Lotion  Keri Deep Conditioning Original Lotion Dry Skin Formula Softly Scented  Keri Deep Conditioning Original Lotion, Fragrance Free Sensitive Skin Formula  Keri Lotion Fast Absorbing Fragrance Free Sensitive Skin Formula  Keri Lotion Fast Absorbing Softly Scented Dry Skin Formula  Keri Original Lotion  Keri Skin Renewal Lotion Keri Silky Smooth Lotion  Keri Silky Smooth Sensitive Skin  Lotion  Nivea Body Creamy Conditioning Oil  Nivea Body Extra Enriched Lotion  Nivea Body Original Lotion  Nivea Body Sheer Moisturizing Lotion Nivea Crme  Nivea Skin Firming Lotion  NutraDerm 30 Skin Lotion  NutraDerm Skin Lotion  NutraDerm Therapeutic Skin Cream  NutraDerm Therapeutic Skin Lotion  ProShield Protective Hand Cream  Provon moisturizing lotion   FAILURE TO FOLLOW THESE INSTRUCTIONS MAY RESULT IN THE CANCELLATION OF YOUR SURGERY  PATIENT SIGNATURE_________________________________  NURSE SIGNATURE__________________________________  ________________________________________________________________________        Carlos Grant    An incentive spirometer is a tool that can help keep your lungs clear and active. This tool measures  how well you are filling your lungs with each breath. Taking long deep breaths may help reverse or decrease the chance of developing breathing (pulmonary) problems (especially infection) following: A long period of time when you are unable to move or be active. BEFORE THE PROCEDURE  If the spirometer includes an indicator to show your best effort, your nurse or respiratory therapist will set it to a desired goal. If possible, sit up straight or lean slightly forward. Try not to slouch. Hold the incentive spirometer in an upright position. INSTRUCTIONS FOR USE  Sit on the edge of your bed if possible, or sit up as far as you can in bed or on a chair. Hold the incentive spirometer in an upright position. Breathe out normally. Place the mouthpiece in your mouth and seal your lips tightly around it. Breathe in slowly and as deeply as possible, raising the piston or the ball toward the top of the column. Hold your breath for 3-5 seconds or for as long as possible. Allow the piston or ball to fall to the bottom of the column. Remove the mouthpiece from your mouth and breathe out normally. Rest for a few seconds and repeat Steps 1  through 7 at least 10 times every 1-2 hours when you are awake. Take your time and take a few normal breaths between deep breaths. The spirometer may include an indicator to show your best effort. Use the indicator as a goal to work toward during each repetition. After each set of 10 deep breaths, practice coughing to be sure your lungs are clear. If you have an incision (the cut made at the time of surgery), support your incision when coughing by placing a pillow or rolled up towels firmly against it. Once you are able to get out of bed, walk around indoors and cough well. You may stop using the incentive spirometer when instructed by your caregiver.  RISKS AND COMPLICATIONS Take your time so you do not get dizzy or light-headed. If you are in pain, you may need to take or ask for pain medication before doing incentive spirometry. It is harder to take a deep breath if you are having pain. AFTER USE Rest and breathe slowly and easily. It can be helpful to keep track of a log of your progress. Your caregiver can provide you with a simple table to help with this. If you are using the spirometer at home, follow these instructions: SEEK MEDICAL CARE IF:  You are having difficultly using the spirometer. You have trouble using the spirometer as often as instructed. Your pain medication is not giving enough relief while using the spirometer. You develop fever of 100.5 F (38.1 C) or higher.                                                                                                    SEEK IMMEDIATE MEDICAL CARE IF:  You cough up bloody sputum that had not been present before. You develop fever of 102 F (38.9 C) or greater. You develop worsening pain at or near the incision site. MAKE  SURE YOU:  Understand these instructions. Will watch your condition. Will get help right away if you are not doing well or get worse. Document Released: 12/25/2006 Document Revised: 11/06/2011 Document Reviewed:  02/25/2007 [facility] Patient Information 2014 [place], [place].        WHAT IS A BLOOD TRANSFUSION? Blood Transfusion Information  A transfusion is the replacement of blood or some of its parts. Blood is made up of multiple cells which provide different functions. Red blood cells carry oxygen and are used for blood loss replacement. White blood cells fight against infection. Platelets control bleeding. Plasma helps clot blood. Other blood products are available for specialized needs, such as hemophilia or other clotting disorders. BEFORE THE TRANSFUSION  Who gives blood for transfusions?  Healthy volunteers who are fully evaluated to make sure their blood is safe. This is blood bank blood. Transfusion therapy is the safest it has ever been in the practice of medicine. Before blood is taken from a donor, a complete history is taken to make sure that person has no history of diseases nor engages in risky social behavior (examples are intravenous drug use or sexual activity with multiple partners). The donor's travel history is screened to minimize risk of transmitting infections, such as malaria. The donated blood is tested for signs of infectious diseases, such as HIV and hepatitis. The blood is then tested to be sure it is compatible with you in order to minimize the chance of a transfusion reaction. If you or a relative donates blood, this is often done in anticipation of surgery and is not appropriate for emergency situations. It takes many days to process the donated blood. RISKS AND COMPLICATIONS Although transfusion therapy is very safe and saves many lives, the main dangers of transfusion include:  Getting an infectious disease. Developing a transfusion reaction. This is an allergic reaction to something in the blood you were given. Every precaution is taken to prevent this. The decision to have a blood transfusion has been considered carefully by your caregiver before blood is given.  Blood is not given unless the benefits outweigh the risks. AFTER THE TRANSFUSION Right after receiving a blood transfusion, you will usually feel much better and more energetic. This is especially true if your red blood cells have gotten low (anemic). The transfusion raises the level of the red blood cells which carry oxygen, and this usually causes an energy increase. The nurse administering the transfusion will monitor you carefully for complications. HOME CARE INSTRUCTIONS  No special instructions are needed after a transfusion. You may find your energy is better. Speak with your caregiver about any limitations on activity for underlying diseases you may have. SEEK MEDICAL CARE IF:  Your condition is not improving after your transfusion. You develop redness or irritation at the intravenous (IV) site. SEEK IMMEDIATE MEDICAL CARE IF:  Any of the following symptoms occur over the next 12 hours: Shaking chills. You have a temperature by mouth above 102 F (38.9 C), not controlled by medicine. Chest, back, or muscle pain. People around you feel you are not acting correctly or are confused. Shortness of breath or difficulty breathing. Dizziness and fainting. You get a rash or develop hives. You have a decrease in urine output. Your urine turns a dark color or changes to pink, red, or brown. Any of the following symptoms occur over the next 10 days: You have a temperature by mouth above 102 F (38.9 C), not controlled by medicine. Shortness of breath. Weakness after normal activity.  The white part of the eye turns yellow (jaundice). You have a decrease in the amount of urine or are urinating less often. Your urine turns a dark color or changes to pink, red, or brown. Document Released: 08/11/2000 Document Revised: 11/06/2011 Document Reviewed: 03/30/2008 [facility] Patient Information 2014 [place],  [place].  _______________________________________________________________________        If you would like to see a video about joint replacement:   [email or web address]

## 2024-08-18 ENCOUNTER — Encounter (HOSPITAL_COMMUNITY)
Admission: RE | Admit: 2024-08-18 | Discharge: 2024-08-18 | Disposition: A | Discharge status: Home / Self Care | Source: Ambulatory Visit | Attending: Orthopaedic Surgery | Admitting: Orthopaedic Surgery

## 2024-08-18 ENCOUNTER — Other Ambulatory Visit: Payer: Self-pay

## 2024-08-18 ENCOUNTER — Encounter (HOSPITAL_COMMUNITY): Payer: Self-pay

## 2024-08-18 VITALS — BP 162/98 | HR 90 | Temp 98.1°F | Resp 22 | Ht 68.0 in | Wt 167.0 lb

## 2024-08-18 DIAGNOSIS — M1611 Unilateral primary osteoarthritis, right hip: Secondary | ICD-10-CM | POA: Insufficient documentation

## 2024-08-18 DIAGNOSIS — Z01812 Encounter for preprocedural laboratory examination: Secondary | ICD-10-CM | POA: Insufficient documentation

## 2024-08-18 HISTORY — DX: Chronic pain syndrome: G89.4

## 2024-08-18 HISTORY — DX: Myoneural disorder, unspecified: G70.9

## 2024-08-18 HISTORY — DX: Essential (primary) hypertension: I10

## 2024-08-18 HISTORY — DX: Chronic obstructive pulmonary disease, unspecified: J44.9

## 2024-08-18 LAB — CBC
HCT: 45.2 % (ref 39.0–52.0)
Hemoglobin: 15.3 g/dL (ref 13.0–17.0)
MCH: 32.2 pg (ref 26.0–34.0)
MCHC: 33.8 g/dL (ref 30.0–36.0)
MCV: 95.2 fL (ref 80.0–100.0)
Platelets: 242 K/uL (ref 150–400)
RBC: 4.75 MIL/uL (ref 4.22–5.81)
RDW: 13 % (ref 11.5–15.5)
WBC: 8.1 K/uL (ref 4.0–10.5)
nRBC: 0 % (ref 0.0–0.2)

## 2024-08-18 LAB — BASIC METABOLIC PANEL WITH GFR
Anion gap: 10 (ref 5–15)
BUN: 9 mg/dL (ref 8–23)
CO2: 24 mmol/L (ref 22–32)
Calcium: 9.6 mg/dL (ref 8.9–10.3)
Chloride: 102 mmol/L (ref 98–111)
Creatinine, Ser: 0.82 mg/dL (ref 0.61–1.24)
GFR, Estimated: >60 mL/min
Glucose, Bld: 105 mg/dL — ABNORMAL HIGH (ref 70–99)
Potassium: 4.8 mmol/L (ref 3.5–5.1)
Sodium: 136 mmol/L (ref 135–145)

## 2024-08-18 LAB — TYPE AND SCREEN
ABO/RH(D): O POS
Antibody Screen: NEGATIVE

## 2024-08-18 LAB — SURGICAL PCR SCREEN
MRSA, PCR: NEGATIVE
Staphylococcus aureus: NEGATIVE

## 2024-08-19 NOTE — Progress Notes (Signed)
 " Case: [id number] Date/Time: 08/29/24 0830   Procedure: ARTHROPLASTY, HIP, TOTAL, ANTERIOR APPROACH (Right: Hip)   Anesthesia type: Choice   Diagnosis: Primary osteoarthritis of right hip [M16.11]   Pre-op diagnosis: osteoarthritis right hip   Location[facility] ROOM 10 / [facility]   Surgeons: Vernetta Lonni GRADE, MD       DISCUSSION: Carlos Grant is a 63 yo male with PMH of former smoking, HTN, COPD, chronic pain  Patient is s/p L hip surgery in 2023 under spinal anesthesia. No complications noted.  Patient evaluated by cardiology for chronic shortness of breath and palpitations on 07/09/2024.  Echo and Zio patch were ordered.  Event monitor was remarkable for a single pause however patient was asleep and EP did not advise any further workup.  Echo showed normal LVEF with mildly elevated pulmonary artery pressures but was otherwise normal. Regarding clearance:  Chart reviewed as part of pre-operative protocol coverage. Given past medical history and time since last visit, based on ACC/AHA guidelines, Carlos Grant is at acceptable risk for the planned procedure without further cardiovascular testing.  Patient recently underwent echocardiogram which showed normal ejection fraction, mildly elevated right chamber pressure.  Suspect patient likely has undiagnosed underlying COPD as a former smoker.  Will need to closely monitor his breathing at the time of surgery.  He does not have a limitation from the cardiac perspective to proceed with upcoming procedure.    VS: BP (!) 162/98 Comment: right arm sitting,  patient is not on any BP meds  Pulse 90   Temp 36.7 C (Oral)   Resp (!) 22   Ht 5' 8 (1.727 m)   Wt 75.8 kg   SpO2 98%   BMI 25.39 kg/m   PROVIDERS: Merna Huxley, NP   LABS: Labs reviewed: Acceptable for surgery. (all labs ordered are listed, but only abnormal results are displayed)  Labs Reviewed  BASIC METABOLIC PANEL WITH GFR - Abnormal; Notable for the following  components:      Result Value   Glucose, Bld 105 (*)    All other components within normal limits  SURGICAL PCR SCREEN  CBC  TYPE AND SCREEN    EKG 07/09/2024:  Normal sinus rhythm with sinus arrhythmia   Echo 08/13/24:  IMPRESSIONS    1. Left ventricular ejection fraction, by estimation, is 60 to 65%. The left ventricle has normal function. The left ventricle has no regional wall motion abnormalities. Left ventricular diastolic parameters were normal.  2. Right ventricular systolic function is normal. The right ventricular size is normal. There is mildly elevated pulmonary artery systolic pressure. The estimated right ventricular systolic pressure is 38.3 mmHg.  3. The mitral valve is grossly normal. Trivial mitral valve regurgitation.  4. The aortic valve is tricuspid. Aortic valve regurgitation is not visualized.  5. The inferior vena cava is normal in size with greater than 50% respiratory variability, suggesting right atrial pressure of 3 mmHg.  Comparison(s): No prior Echocardiogram.  Zio patch 08/06/24:    HR 38-185, average 86 bpm   Predominantly normal sinus rhythm   4.7-second pause with ventricular asystole and concern for high-grade AV block occurring at 12:16 AM.  Not associated with a triggered event   Occasional nonsustained SVT.  Not associated with a triggered event.   No sustained arrhythmias.   [name], DO   Past Medical History:  Diagnosis Date   Arthritis    Chronic pain disorder    Lumbar and cervical stenosis,  no back surgery  yet   COPD (chronic obstructive pulmonary disease) (HCC)    Hypertension    Neuromuscular disorder (HCC)    lumbar radiculopathy    Past Surgical History:  Procedure Laterality Date   LUMBAR EPIDURAL INJECTION     Lumbar   TOTAL HIP ARTHROPLASTY Left 10/21/2021   Procedure: Left TOTAL HIP ARTHROPLASTY ANTERIOR APPROACH;  Surgeon: Vernetta Lonni GRADE, MD;  Location: [facility];  Service: Orthopedics;   Laterality: Left;   WISDOM TOOTH EXTRACTION      MEDICATIONS:  ibuprofen (ADVIL) 200 MG tablet   No current facility-administered medications for this encounter.    Burnard CHRISTELLA Odis DEVONNA [facility] Short Stay/Anesthesiology [facility] Phone [phone] 08/19/2024 9:51 AM       "

## 2024-08-27 ENCOUNTER — Ambulatory Visit: Admitting: Physician Assistant

## 2024-08-27 NOTE — Anesthesia Preprocedure Evaluation (Addendum)
 "                                  Anesthesia Evaluation  Patient identified by MRN, date of birth, ID band Patient awake    Reviewed: Allergy & Precautions, NPO status , Patient's Chart, lab work & pertinent test results  History of Anesthesia Complications Negative for: history of anesthetic complications  Airway Mallampati: II  TM Distance: >3 FB Neck ROM: Full    Dental no notable dental hx. (+) Teeth Intact, Dental Advisory Given,    Pulmonary COPD, former smoker   Pulmonary exam normal breath sounds clear to auscultation       Cardiovascular hypertension, (-) angina (-) Past MI Normal cardiovascular exam Rhythm:Regular Rate:Normal  08/13/2024 TTE  1. Left ventricular ejection fraction, by estimation, is 60 to 65%. The left ventricle has normal function. The left ventricle has no regional wall motion abnormalities. Left ventricular diastolic parameters were normal.  2. Right ventricular systolic function is normal. The right ventricular size is normal. There is mildly elevated pulmonary artery systolic pressure. The estimated right ventricular systolic pressure is 38.3 mmHg.  3. The mitral valve is grossly normal. Trivial mitral valve regurgitation.  4. The aortic valve is tricuspid. Aortic valve regurgitation is not visualized.  5. The inferior vena cava is normal in size with greater than 50% respiratory variability, suggesting right atrial pressure of 3 mmHg.    Neuro/Psych  negative psych ROS   GI/Hepatic negative GI ROS, Neg liver ROS,,,  Endo/Other  negative endocrine ROS    Renal/GU Lab Results      Component                Value               Date                          K                        4.8                 08/18/2024                   CREATININE               0.82                08/18/2024                     Musculoskeletal  (+) Arthritis ,    Abdominal   Peds  Hematology Lab Results      Component                Value                Date                      WBC                      8.1                 08/18/2024                HGB  15.3                08/18/2024                HCT                      45.2                08/18/2024                MCV                      95.2                08/18/2024                PLT                      242                 08/18/2024              Anesthesia Other Findings L hip done in 2023  Reproductive/Obstetrics negative OB ROS                              Anesthesia Physical Anesthesia Plan  ASA: 3  Anesthesia Plan: Spinal   Post-op Pain Management: Ofirmev  IV (intra-op)*   Induction:   PONV Risk Score and Plan: 2 and Propofol  infusion, Midazolam , Ondansetron  and Treatment may vary due to age or medical condition  Airway Management Planned: Nasal Cannula and Natural Airway  Additional Equipment: None  Intra-op Plan:   Post-operative Plan:   Informed Consent: I have reviewed the patients History and Physical, chart, labs and discussed the procedure including the risks, benefits and alternatives for the proposed anesthesia with the patient or authorized representative who has indicated his/her understanding and acceptance.     Dental advisory given  Plan Discussed with:   Anesthesia Plan Comments: (See PAT note from 12/22)         Anesthesia Quick Evaluation  "

## 2024-08-28 NOTE — H&P (Signed)
 TOTAL HIP ADMISSION H&P  Patient is admitted for right total hip arthroplasty.  Subjective:  Chief Complaint: right hip pain  HPI: Carlos Grant, 64 y.o. male, has a history of pain and functional disability in the right hip(s) due to arthritis and patient has failed non-surgical conservative treatments for greater than 12 weeks to include NSAID's and/or analgesics, flexibility and strengthening excercises, use of assistive devices, and activity modification.  Onset of symptoms was gradual starting about a year ago with gradually worsening course since that time.The patient noted no past surgery on the right hip(s).  Patient currently rates pain in the right hip at 10 out of 10 with activity. Patient has night pain, worsening of pain with activity and weight bearing, trendelenberg gait, pain that interfers with activities of daily living, pain with passive range of motion, and crepitus. Patient has evidence of subchondral sclerosis, periarticular osteophytes, and joint space narrowing by imaging studies. This condition presents safety issues increasing the risk of falls.  There is no current active infection.  Patient Active Problem List   Diagnosis Date Noted   Unilateral primary osteoarthritis, right hip 11/19/2023   Other spondylosis with myelopathy, cervical region 02/17/2022   Spinal stenosis of lumbar region 02/17/2022   Status post left hip replacement 10/21/2021   Avascular necrosis of hip, left (HCC) 08/30/2021   Past Medical History:  Diagnosis Date   Arthritis    Chronic pain disorder    Lumbar and cervical stenosis,  no back surgery yet   COPD (chronic obstructive pulmonary disease) (HCC)    Hypertension    Neuromuscular disorder (HCC)    lumbar radiculopathy    Past Surgical History:  Procedure Laterality Date   LUMBAR EPIDURAL INJECTION     Lumbar   TOTAL HIP ARTHROPLASTY Left 10/21/2021   Procedure: Left TOTAL HIP ARTHROPLASTY ANTERIOR APPROACH;  Surgeon: Vernetta Lonni GRADE, MD;  Location: [facility];  Service: Orthopedics;  Laterality: Left;   WISDOM TOOTH EXTRACTION      No current facility-administered medications for this encounter.   Current Outpatient Medications  Medication Sig Dispense Refill Last Dose/Taking   ibuprofen (ADVIL) 200 MG tablet Take 800-1,000 mg by mouth 2 (two) times daily as needed (pain.).   Taking As Needed   Allergies[1]  Social History   Tobacco Use   Smoking status: Former    Current packs/day: 0.00    Average packs/day: 1.0 packs/day    Types: Cigarettes    Quit date: 2013    Years since quitting: 13.0   Smokeless tobacco: Never   Tobacco comments:    Smoked for 20 years, quit in 2013  Substance Use Topics   Alcohol use: Yes    Alcohol/week: 14.0 standard drinks of alcohol    Types: 14 Cans of beer per week    Comment: 2-3 a night    Family History  Problem Relation Age of Onset   Arthritis Mother    Stroke Mother    Hypertension Father    Arthritis Father    Hearing loss Father    Arthritis Brother    Arthritis Maternal Grandmother    Heart attack Maternal Grandfather    Arthritis Paternal Grandfather    Heart attack Paternal Grandfather    Heart disease Paternal Grandfather    Hyperlipidemia Paternal Grandfather      Review of Systems  Objective:  Physical Exam Vitals reviewed.  Constitutional:      Appearance: Normal appearance. He is normal weight.  HENT:  Head: Normocephalic and atraumatic.  Eyes:     Extraocular Movements: Extraocular movements intact.     Pupils: Pupils are equal, round, and reactive to light.  Cardiovascular:     Rate and Rhythm: Normal rate and regular rhythm.  Pulmonary:     Effort: Pulmonary effort is normal.     Breath sounds: Normal breath sounds.  Abdominal:     Palpations: Abdomen is soft.  Musculoskeletal:     Cervical back: Normal range of motion and neck supple.     Right hip: Tenderness and bony tenderness present. Decreased range of motion.  Decreased strength.  Neurological:     Mental Status: He is alert and oriented to person, place, and time.  Psychiatric:        Behavior: Behavior normal.     Vital signs in last 24 hours:    Labs:   Estimated body mass index is 25.39 kg/m as calculated from the following:   Height as of 08/18/24: 5' 8 (1.727 m).   Weight as of 08/18/24: 75.8 kg.   Imaging Review Plain radiographs demonstrate severe degenerative joint disease of the right hip(s). The bone quality appears to be excellent for age and reported activity level.      Assessment/Plan:  End stage arthritis, right hip(s)  The patient history, physical examination, clinical judgement of the provider and imaging studies are consistent with end stage degenerative joint disease of the right hip(s) and total hip arthroplasty is deemed medically necessary. The treatment options including medical management, injection therapy, arthroscopy and arthroplasty were discussed at length. The risks and benefits of total hip arthroplasty were presented and reviewed. The risks due to aseptic loosening, infection, stiffness, dislocation/subluxation,  thromboembolic complications and other imponderables were discussed.  The patient acknowledged the explanation, agreed to proceed with the plan and consent was signed. Patient is being admitted for inpatient treatment for surgery, pain control, PT, OT, prophylactic antibiotics, VTE prophylaxis, progressive ambulation and ADL's and discharge planning.The patient is planning to be discharged home with home health services       [1] No Known Allergies

## 2024-08-29 ENCOUNTER — Ambulatory Visit (HOSPITAL_COMMUNITY): Payer: Self-pay | Admitting: Medical

## 2024-08-29 ENCOUNTER — Other Ambulatory Visit: Payer: Self-pay

## 2024-08-29 ENCOUNTER — Ambulatory Visit (HOSPITAL_COMMUNITY)
Admission: RE | Admit: 2024-08-29 | Discharge: 2024-08-29 | Disposition: A | Discharge status: Home / Self Care | Source: Home / Self Care | Attending: Orthopaedic Surgery | Admitting: Orthopaedic Surgery

## 2024-08-29 ENCOUNTER — Encounter (HOSPITAL_COMMUNITY)
Admission: RE | Disposition: A | Discharge status: Home / Self Care | Payer: Self-pay | Source: Home / Self Care | Attending: Orthopaedic Surgery

## 2024-08-29 ENCOUNTER — Ambulatory Visit (HOSPITAL_COMMUNITY)

## 2024-08-29 ENCOUNTER — Ambulatory Visit (HOSPITAL_COMMUNITY): Admitting: Anesthesiology

## 2024-08-29 DIAGNOSIS — Z87891 Personal history of nicotine dependence: Secondary | ICD-10-CM | POA: Diagnosis not present

## 2024-08-29 DIAGNOSIS — Z96642 Presence of left artificial hip joint: Secondary | ICD-10-CM | POA: Insufficient documentation

## 2024-08-29 DIAGNOSIS — I10 Essential (primary) hypertension: Secondary | ICD-10-CM | POA: Diagnosis not present

## 2024-08-29 DIAGNOSIS — J449 Chronic obstructive pulmonary disease, unspecified: Secondary | ICD-10-CM | POA: Insufficient documentation

## 2024-08-29 DIAGNOSIS — M1611 Unilateral primary osteoarthritis, right hip: Secondary | ICD-10-CM

## 2024-08-29 HISTORY — PX: TOTAL HIP ARTHROPLASTY: SHX124

## 2024-08-29 SURGERY — ARTHROPLASTY, HIP, TOTAL, ANTERIOR APPROACH
Anesthesia: Spinal | Site: Hip | Laterality: Right

## 2024-08-29 MED ORDER — LACTATED RINGERS IV BOLUS
500.0000 mL | Freq: Once | INTRAVENOUS | Status: AC
  Administered 2024-08-29: 500 mL via INTRAVENOUS

## 2024-08-29 MED ORDER — METOCLOPRAMIDE HCL 5 MG/ML IJ SOLN: 5.0000 mg | Freq: Three times a day (TID) | INTRAMUSCULAR | Status: DC | PRN

## 2024-08-29 MED ORDER — CEFAZOLIN SODIUM-DEXTROSE 2-4 GM/100ML-% IV SOLN
INTRAVENOUS | Status: AC
  Filled 2024-08-29: qty 100

## 2024-08-29 MED ORDER — CHLORHEXIDINE GLUCONATE 0.12 % MT SOLN
15.0000 mL | Freq: Once | OROMUCOSAL | Status: AC
  Administered 2024-08-29: 15 mL via OROMUCOSAL

## 2024-08-29 MED ORDER — CEFAZOLIN SODIUM-DEXTROSE 2-4 GM/100ML-% IV SOLN
2.0000 g | INTRAVENOUS | Status: AC
  Administered 2024-08-29: 2 g via INTRAVENOUS
  Filled 2024-08-29: qty 100

## 2024-08-29 MED ORDER — MIDAZOLAM HCL 2 MG/2ML IJ SOLN
INTRAMUSCULAR | Status: AC
  Filled 2024-08-29: qty 2

## 2024-08-29 MED ORDER — MORPHINE SULFATE (PF) 2 MG/ML IV SOLN
0.5000 mg | INTRAVENOUS | Status: DC | PRN
  Administered 2024-08-29: 1 mg via INTRAVENOUS

## 2024-08-29 MED ORDER — KETOROLAC TROMETHAMINE 15 MG/ML IJ SOLN
7.5000 mg | Freq: Four times a day (QID) | INTRAMUSCULAR | Status: DC
  Administered 2024-08-29: 7.5 mg via INTRAVENOUS

## 2024-08-29 MED ORDER — DEXMEDETOMIDINE HCL IN NACL 80 MCG/20ML IV SOLN
INTRAVENOUS | Status: DC | PRN
  Administered 2024-08-29: 8 ug via INTRAVENOUS
  Administered 2024-08-29: 4 ug via INTRAVENOUS

## 2024-08-29 MED ORDER — PROPOFOL 10 MG/ML IV BOLUS
INTRAVENOUS | Status: DC | PRN
  Administered 2024-08-29 (×2): 90 mg via INTRAVENOUS

## 2024-08-29 MED ORDER — LIDOCAINE HCL (PF) 2 % IJ SOLN
INTRAMUSCULAR | Status: AC
  Filled 2024-08-29: qty 5

## 2024-08-29 MED ORDER — PHENYLEPHRINE 80 MCG/ML (10ML) SYRINGE FOR IV PUSH (FOR BLOOD PRESSURE SUPPORT)
PREFILLED_SYRINGE | INTRAVENOUS | Status: DC | PRN
  Administered 2024-08-29: 160 ug via INTRAVENOUS

## 2024-08-29 MED ORDER — PROPOFOL 10 MG/ML IV BOLUS
INTRAVENOUS | Status: AC
  Filled 2024-08-29: qty 20

## 2024-08-29 MED ORDER — MIDAZOLAM HCL (PF) 2 MG/2ML IJ SOLN
INTRAMUSCULAR | Status: DC | PRN
  Administered 2024-08-29: 2 mg via INTRAVENOUS

## 2024-08-29 MED ORDER — STERILE WATER FOR IRRIGATION IR SOLN
Status: DC | PRN
  Administered 2024-08-29 (×2): 1000 mL

## 2024-08-29 MED ORDER — HYDROCODONE-ACETAMINOPHEN 7.5-325 MG PO TABS
ORAL_TABLET | ORAL | Status: AC
  Filled 2024-08-29: qty 2

## 2024-08-29 MED ORDER — CEFAZOLIN SODIUM-DEXTROSE 2-4 GM/100ML-% IV SOLN
2.0000 g | Freq: Four times a day (QID) | INTRAVENOUS | Status: AC
  Administered 2024-08-29: 2 g via INTRAVENOUS

## 2024-08-29 MED ORDER — LACTATED RINGERS IV SOLN: INTRAVENOUS | Status: DC

## 2024-08-29 MED ORDER — MORPHINE SULFATE (PF) 2 MG/ML IV SOLN
INTRAVENOUS | Status: AC
  Filled 2024-08-29: qty 1

## 2024-08-29 MED ORDER — TRANEXAMIC ACID-NACL 1000-0.7 MG/100ML-% IV SOLN
1000.0000 mg | INTRAVENOUS | Status: AC
  Administered 2024-08-29: 1000 mg via INTRAVENOUS
  Filled 2024-08-29: qty 100

## 2024-08-29 MED ORDER — AMISULPRIDE (ANTIEMETIC) 5 MG/2ML IV SOLN: 10.0000 mg | Freq: Once | INTRAVENOUS | Status: DC | PRN

## 2024-08-29 MED ORDER — 0.9 % SODIUM CHLORIDE (POUR BTL) OPTIME
TOPICAL | Status: DC | PRN
  Administered 2024-08-29: 1000 mL

## 2024-08-29 MED ORDER — HYDROMORPHONE HCL 1 MG/ML IJ SOLN
INTRAMUSCULAR | Status: AC
  Filled 2024-08-29: qty 1

## 2024-08-29 MED ORDER — ONDANSETRON HCL 4 MG/2ML IJ SOLN: 4.0000 mg | Freq: Once | INTRAMUSCULAR | Status: DC | PRN

## 2024-08-29 MED ORDER — TIZANIDINE HCL 4 MG PO TABS: 4.0000 mg | ORAL_TABLET | Freq: Four times a day (QID) | ORAL | 0 refills | Status: DC | PRN

## 2024-08-29 MED ORDER — FENTANYL CITRATE (PF) 100 MCG/2ML IJ SOLN
INTRAMUSCULAR | Status: AC
  Filled 2024-08-29: qty 2

## 2024-08-29 MED ORDER — FENTANYL CITRATE (PF) 250 MCG/5ML IJ SOLN
INTRAMUSCULAR | Status: DC | PRN
  Administered 2024-08-29: 100 ug via INTRAVENOUS

## 2024-08-29 MED ORDER — HYDROCODONE-ACETAMINOPHEN 5-325 MG PO TABS: 1.0000 | ORAL_TABLET | ORAL | Status: DC | PRN

## 2024-08-29 MED ORDER — HYDROCODONE-ACETAMINOPHEN 7.5-325 MG PO TABS
1.0000 | ORAL_TABLET | ORAL | Status: DC | PRN
  Administered 2024-08-29: 2 via ORAL

## 2024-08-29 MED ORDER — ONDANSETRON HCL 4 MG/2ML IJ SOLN: 4.0000 mg | Freq: Four times a day (QID) | INTRAMUSCULAR | Status: DC | PRN

## 2024-08-29 MED ORDER — ACETAMINOPHEN 10 MG/ML IV SOLN: 1000.0000 mg | Freq: Once | INTRAVENOUS | Status: DC | PRN

## 2024-08-29 MED ORDER — ORAL CARE MOUTH RINSE: 15.0000 mL | Freq: Once | OROMUCOSAL | Status: AC

## 2024-08-29 MED ORDER — PHENYLEPHRINE 80 MCG/ML (10ML) SYRINGE FOR IV PUSH (FOR BLOOD PRESSURE SUPPORT)
PREFILLED_SYRINGE | INTRAVENOUS | Status: AC
  Filled 2024-08-29: qty 10

## 2024-08-29 MED ORDER — HYDROMORPHONE HCL 1 MG/ML IJ SOLN
0.2500 mg | INTRAMUSCULAR | Status: DC | PRN
  Administered 2024-08-29: 0.5 mg via INTRAVENOUS

## 2024-08-29 MED ORDER — KETOROLAC TROMETHAMINE 30 MG/ML IJ SOLN
INTRAMUSCULAR | Status: AC
  Filled 2024-08-29: qty 1

## 2024-08-29 MED ORDER — HYDROCODONE-ACETAMINOPHEN 5-325 MG PO TABS: 1.0000 | ORAL_TABLET | Freq: Four times a day (QID) | ORAL | 0 refills | Status: DC | PRN

## 2024-08-29 MED ORDER — METOCLOPRAMIDE HCL 5 MG PO TABS: 5.0000 mg | ORAL_TABLET | Freq: Three times a day (TID) | ORAL | Status: DC | PRN

## 2024-08-29 MED ORDER — ACETAMINOPHEN 10 MG/ML IV SOLN
INTRAVENOUS | Status: AC
  Filled 2024-08-29: qty 100

## 2024-08-29 MED ORDER — KETOROLAC TROMETHAMINE 15 MG/ML IJ SOLN
INTRAMUSCULAR | Status: AC
  Filled 2024-08-29: qty 1

## 2024-08-29 MED ORDER — OXYCODONE HCL 5 MG/5ML PO SOLN: 5.0000 mg | Freq: Once | ORAL | Status: DC | PRN

## 2024-08-29 MED ORDER — MEPIVACAINE HCL (PF) 2 % IJ SOLN
INTRAMUSCULAR | Status: DC | PRN
  Administered 2024-08-29: 64 mg via EPIDURAL

## 2024-08-29 MED ORDER — PHENYLEPHRINE HCL-NACL 20-0.9 MG/250ML-% IV SOLN
INTRAVENOUS | Status: AC
  Filled 2024-08-29: qty 500

## 2024-08-29 MED ORDER — OXYCODONE HCL 5 MG PO TABS: 5.0000 mg | ORAL_TABLET | Freq: Once | ORAL | Status: DC | PRN

## 2024-08-29 MED ORDER — ONDANSETRON HCL 4 MG PO TABS: 4.0000 mg | ORAL_TABLET | Freq: Four times a day (QID) | ORAL | Status: DC | PRN

## 2024-08-29 MED ORDER — PROPOFOL 500 MG/50ML IV EMUL
INTRAVENOUS | Status: DC | PRN
  Administered 2024-08-29: 75 ug/kg/min via INTRAVENOUS

## 2024-08-29 MED ORDER — ACETAMINOPHEN 10 MG/ML IV SOLN
INTRAVENOUS | Status: DC | PRN
  Administered 2024-08-29: 1000 mg via INTRAVENOUS

## 2024-08-29 MED ORDER — LACTATED RINGERS IV BOLUS
250.0000 mL | Freq: Once | INTRAVENOUS | Status: AC
  Administered 2024-08-29: 250 mL via INTRAVENOUS

## 2024-08-29 SURGICAL SUPPLY — 36 items
BAG COUNTER SPONGE SURGICOUNT (BAG) ×1 IMPLANT
BAG ZIPLOCK 12X15 (MISCELLANEOUS) ×1 IMPLANT
BENZOIN TINCTURE PRP APPL 2/3 (GAUZE/BANDAGES/DRESSINGS) IMPLANT
BLADE SAW SGTL 18X1.27X75 (BLADE) ×1 IMPLANT
COVER PERINEAL POST (MISCELLANEOUS) ×1 IMPLANT
COVER SURGICAL LIGHT HANDLE (MISCELLANEOUS) ×1 IMPLANT
CUP ACET PNNCL SECTR W/GRIP 56 (Hips) IMPLANT
DRAPE FOOT SWITCH (DRAPES) ×1 IMPLANT
DRAPE STERI IOBAN 125X83 (DRAPES) ×1 IMPLANT
DRAPE U-SHAPE 47X51 STRL (DRAPES) ×2 IMPLANT
DRSG AQUACEL AG ADV 3.5X10 (GAUZE/BANDAGES/DRESSINGS) ×1 IMPLANT
DURAPREP 26ML APPLICATOR (WOUND CARE) ×1 IMPLANT
ELECT PENCIL ROCKER SW 15FT (MISCELLANEOUS) ×1 IMPLANT
ELECT REM PT RETURN 15FT ADLT (MISCELLANEOUS) ×1 IMPLANT
GAUZE XEROFORM 1X8 LF (GAUZE/BANDAGES/DRESSINGS) IMPLANT
GLOVE BIO SURGEON STRL SZ7.5 (GLOVE) ×1 IMPLANT
GLOVE BIOGEL PI IND STRL 8 (GLOVE) ×2 IMPLANT
GLOVE SURG ORTHO 8.0 STRL STRW (GLOVE) ×1 IMPLANT
GOWN STRL REUS W/ TWL XL LVL3 (GOWN DISPOSABLE) ×2 IMPLANT
HEAD CERAMIC DELTA 36 PLUS 1.5 (Hips) IMPLANT
HOLDER FOLEY CATH W/STRAP (MISCELLANEOUS) ×1 IMPLANT
KIT TURNOVER KIT A (KITS) ×1 IMPLANT
PACK ANTERIOR HIP CUSTOM (KITS) ×1 IMPLANT
PINNACLE ALTRX PLUS 4 N 36X56 (Hips) IMPLANT
SET HNDPC FAN SPRY TIP SCT (DISPOSABLE) ×1 IMPLANT
STAPLER SKIN PROX 35W (STAPLE) IMPLANT
STEM FEMORAL SZ6 HIGH ACTIS (Stem) IMPLANT
STRIP CLOSURE SKIN 1/2X4 (GAUZE/BANDAGES/DRESSINGS) IMPLANT
SUT ETHIBOND NAB CT1 #1 30IN (SUTURE) ×1 IMPLANT
SUT ETHILON 2 0 PS N (SUTURE) IMPLANT
SUT MNCRL AB 4-0 PS2 18 (SUTURE) IMPLANT
SUT VIC AB 0 CT1 36 (SUTURE) ×1 IMPLANT
SUT VIC AB 1 CT1 36 (SUTURE) ×1 IMPLANT
SUT VIC AB 2-0 CT1 TAPERPNT 27 (SUTURE) ×2 IMPLANT
TRAY FOLEY MTR SLVR 16FR STAT (SET/KITS/TRAYS/PACK) ×1 IMPLANT
YANKAUER SUCT BULB TIP NO VENT (SUCTIONS) ×1 IMPLANT

## 2024-08-29 NOTE — Anesthesia Procedure Notes (Addendum)
 Spinal  Patient location during procedure: OR End time: 08/29/2024 8:48 AM Reason for block: surgical anesthesia  Staffing Performed: anesthesiologist  Authorized by: Jefm Garnette LABOR, MD   Performed by: Jefm Garnette LABOR, MD  Preanesthetic Checklist Completed: patient identified, IV checked, site marked, risks and benefits discussed, surgical consent, monitors and equipment checked, pre-op evaluation and timeout performed Spinal Block Patient position: sitting Prep: DuraPrep and site prepped and draped Patient monitoring: heart rate, cardiac monitor, continuous pulse ox and blood pressure Approach: midline Location: L3-4 Injection technique: single-shot Needle Needle type: Sprotte  Needle gauge: 24 G Needle length: 9 cm Needle insertion depth (cm): 6 Assessment Sensory level: T4 Events: CSF return  Additional Notes 2 Attempt (s). Pt tolerated procedure well.

## 2024-08-29 NOTE — Interval H&P Note (Signed)
 History and Physical Interval Note: The patient understands that he is here today for a right total hip replacement to treat his significant right hip pain and arthritis.  There has been no acute or interval change in his medical status.  The risks and benefits of surgery have been discussed in detail and informed consent has been obtained.  The right operative hip has been marked.  08/29/2024 6:56 AM  Carlos Grant  has presented today for surgery, with the diagnosis of osteoarthritis right hip.  The various methods of treatment have been discussed with the patient and family. After consideration of risks, benefits and other options for treatment, the patient has consented to  Procedures: ARTHROPLASTY, HIP, TOTAL, ANTERIOR APPROACH (Right) as a surgical intervention.  The patient's history has been reviewed, patient examined, no change in status, stable for surgery.  I have reviewed the patient's chart and labs.  Questions were answered to the patient's satisfaction.     [name]

## 2024-08-29 NOTE — Evaluation (Signed)
 Physical Therapy Evaluation Patient Details Name: Carlos Grant MRN: [id number] DOB: 1961/03/02 Today's Date: 08/29/2024  History of Present Illness  Pt s/p R THR and with hx of L THR, ADD, COPD, lumbar spinal stenosis, and chronic pain  Clinical Impression  Pt s/p R THR and presents with decreased R LE strength/ROM and post op pain limiting functional mobility.  This date, pt up to ambulate in hall, reviewed dressing techniques, negotiated stairs, and performed HEP with written instruction provided and reviewed.  Pt eager for dc home this date.      If plan is discharge home, recommend the following: A little help with walking and/or transfers;A little help with bathing/dressing/bathroom;Assistance with cooking/housework;Assist for transportation;Help with stairs or ramp for entrance   Can travel by private vehicle        Equipment Recommendations None recommended by PT  Recommendations for Other Services       Functional Status Assessment Patient has had a recent decline in their functional status and demonstrates the ability to make significant improvements in function in a reasonable and predictable amount of time.     Precautions / Restrictions Precautions Precautions: Fall Restrictions Weight Bearing Restrictions Per Provider Order: Yes RLE Weight Bearing Per Provider Order: Weight bearing as tolerated      Mobility  Bed Mobility Overal bed mobility: Needs Assistance Bed Mobility: Supine to Sit     Supine to sit: Supervision     General bed mobility comments: sup for safety only    Transfers Overall transfer level: Modified independent Equipment used: Rolling walker (2 wheels)               General transfer comment: cues for LE management and use of UEs to self assist    Ambulation/Gait Ambulation/Gait assistance: Contact guard assist, Supervision Gait Distance (Feet): 100 Feet Assistive device: Rolling walker (2 wheels) Gait Pattern/deviations:  Step-to pattern, Step-through pattern, Decreased step length - right, Decreased step length - left, Shuffle, Trunk flexed       General Gait Details: cues for sequence, posture, position from RW and safety awareness  Stairs Stairs: Yes Stairs assistance: Contact guard assist Stair Management: Two rails, Step to pattern, Forwards Number of Stairs: 3 General stair comments: cues for seqeunce  Wheelchair Mobility     Tilt Bed    Modified Rankin (Stroke Patients Only)       Balance Overall balance assessment: Needs assistance Sitting-balance support: No upper extremity supported, Feet supported Sitting balance-Leahy Scale: Good     Standing balance support: No upper extremity supported Standing balance-Leahy Scale: Fair                               Pertinent Vitals/Pain Pain Assessment Pain Assessment: 0-10 Pain Score: 8  Pain Location: R hip Pain Descriptors / Indicators: Aching, Sore Pain Intervention(s): Limited activity within patient's tolerance, Monitored during session, Premedicated before session (pt declines additional pain meds)    Home Living Family/patient expects to be discharged to:: Private residence Living Arrangements: Spouse/significant other Available Help at Discharge: Family;Available 24 hours/day Type of Home: House Home Access: Stairs to enter Entrance Stairs-Rails: Left;Right;Can reach both Entrance Stairs-Number of Steps: 4   Home Layout: One level Home Equipment: Agricultural Consultant (2 wheels);Cane - single point      Prior Function Prior Level of Function : Independent/Modified Independent  Extremity/Trunk Assessment   Upper Extremity Assessment Upper Extremity Assessment: Overall WFL for tasks assessed    Lower Extremity Assessment Lower Extremity Assessment: RLE deficits/detail RLE Deficits / Details: AAROM at hip to 90 flex and 20 abd    Cervical / Trunk Assessment Cervical / Trunk  Assessment: Normal  Communication   Communication Communication: No apparent difficulties    Cognition Arousal: Alert Behavior During Therapy: WFL for tasks assessed/performed, Impulsive   PT - Cognitive impairments: No apparent impairments                         Following commands: Intact       Cueing Cueing Techniques: Verbal cues     General Comments      Exercises Total Joint Exercises Ankle Circles/Pumps: AROM, Both, 15 reps, Other reps (comment), Supine Quad Sets: AROM, Both, 10 reps, Supine Heel Slides: AAROM, Right, 15 reps, Supine Hip ABduction/ADduction: AAROM, Right, 15 reps, Supine Long Arc Quad: AROM, Right, 10 reps, Seated   Assessment/Plan    PT Assessment Patient needs continued PT services  PT Problem List Decreased strength;Decreased range of motion;Decreased activity tolerance;Decreased balance;Decreased mobility;Decreased knowledge of use of DME;Pain       PT Treatment Interventions DME instruction;Gait training;Stair training;Functional mobility training;Therapeutic activities;Therapeutic exercise;Balance training;Patient/family education    PT Goals (Current goals can be found in the Care Plan section)  Acute Rehab PT Goals Patient Stated Goal: Get back to work PT Goal Formulation: With patient Time For Goal Achievement: 09/05/24 Potential to Achieve Goals: Good    Frequency 7X/week     Co-evaluation               AM-PAC PT 6 Clicks Mobility  Outcome Measure Help needed turning from your back to your side while in a flat bed without using bedrails?: None Help needed moving from lying on your back to sitting on the side of a flat bed without using bedrails?: None Help needed moving to and from a bed to a chair (including a wheelchair)?: A Little Help needed standing up from a chair using your arms (e.g., wheelchair or bedside chair)?: A Little Help needed to walk in hospital room?: A Little Help needed climbing 3-5  steps with a railing? : A Little 6 Click Score: 20    End of Session Equipment Utilized During Treatment: Gait belt Activity Tolerance: Patient tolerated treatment well Patient left: in chair;with call bell/phone within reach;with nursing/sitter in room;with family/visitor present Nurse Communication: Mobility status PT Visit Diagnosis: Difficulty in walking, not elsewhere classified (R26.2)    Time: 8583-8546 PT Time Calculation (min) (ACUTE ONLY): 37 min   Charges:   PT Evaluation $PT Eval Low Complexity: 1 Low PT Treatments $Therapeutic Exercise: 8-22 mins PT General Charges $$ ACUTE PT VISIT: 1 Visit         [facility] PT Acute Rehabilitation Services Office [phone]   Makinsley Schiavi 08/29/2024, 3:14 PM

## 2024-08-29 NOTE — Op Note (Signed)
 "    Operative Note  Date of operation: 08/29/2024 Preoperative diagnosis: Right hip primary osteoarthritis Postoperative diagnosis: Same  Procedure: Right anterior total hip arthroplasty  Implants: Implant Name Type Inv. Item Serial No. Manufacturer Lot No. LRB No. Used Action  CUP ACET PNNCL SECTR W/GRIP 56 - [id number] Hips CUP ACET PNNCL SECTR W/GRIP 56  [employer] [id number] Right 1 Implanted  PINNACLE ALTRX PLUS 4 N [id number] - [id number] Hips PINNACLE ALTRX PLUS 4 N [id number]  [employer] [id number] Right 1 Implanted  STEM FEMORAL SZ6 HIGH ACTIS - [id number] Stem STEM FEMORAL SZ6 HIGH ACTIS  [employer] [id number] Right 1 Implanted  HEAD CERAMIC DELTA 36 PLUS 1.5 - [id number] Hips HEAD CERAMIC DELTA 36 PLUS 1.5  [employer] [id number] Right 1 Implanted   Surgeon: Lonni GRADE. Vernetta, MD Assistant: Almeda Rummer, PA-C  Anesthesia: #1 spinal, #2 General EBL: 150 cc Antibiotics: IV Ancef  Complications: None  Indications: The patient is a 64 year old gentleman with debilitating arthritis involving his right hip this been well-documented with x-ray findings and clinical exam findings.  We have x-rayed placed his left hip before as well.  At this point his right hip pain is daily and it is severe.  It is detrimentally fasting his mobility, his quality of life and his actives daily living to the point he does wish to proceed with a total hip arthroplasty on the right side.  Having had this before the left side he is fully aware of the risks and benefits of the surgery.  Procedure description: After informed consent was obtained the proper right hip was marked, the patient was brought to the operating room and set up on the stretcher where spinal anesthesia was obtained.  He was laid in spine position on stretcher and traction boots were placed on both his feet.  Next he was placed supine on the Hana fracture table with a perineal post and placed in both legs and inline skeletal  traction devices but no traction applied.  The left and right operative hips were assessed radiographically as well as the pelvis.  The right hip was then prepped and draped with DuraPrep and sterile drapes.  A timeout was called and he was identified as the correct patient and the correct right hip.  An incision was then made just inferior and posterior to the ASIS and carried slightly obliquely down the leg.  Dissection was carried down to the tensor fascia lata muscle and the tensor fascia was divided longitudinally to proceed with a direct intra approach the hip.  Circumflex vessels were identified and cauterized.  The hip capsule was identified and opened up in L-type format finding a moderate joint effusion.  Cobra tractors were placed on the medial and lateral femoral neck and a femoral neck cut was made with an oscillating saw just proximal to the lesser trochanter and this cut was completed with an osteotome.  A corkscrew gauze placed in the femoral head and the femoral head was removed in its entirety and there was a wide area devoid of cartilage.  A bent Hohmann was then placed over the medial sterile rim and the [place] labrum and other debris removed.  Reaming was then initiated from a size 43 reamer and stepwise increments going up to a size 55 reamer with all reamers placed under direct visualization and the last reamer also placed under direct fluoroscopy in order to obtain the depth reaming, the inclination and anteversion.  The real DePuy sector GRIPTION acetabular opponent size  56 was then placed without difficulty followed by 36+4 polythene liner.  Attention was then turned to the femur.  With the right lower extremity externally rotated to 120 degrees, extended and adducted, a Mueller retractors placed medially and a Hohmann tractor by the greater trochanter.  The lateral joint capsule was released and a box cutting osteotome was used in the femoral canal.  Broaching was then initiated using  the Actis broaching system from a size 0 going up to a size 6.  With a size 6 and placed a high offset femoral neck was placed based on the anatomy and a 36+1.5 trial head ball.  The right leg was brought over and up and with traction and into rotation reduced in the pelvis.  Assessing it radiographically and clinically we felt like we were little bit longer than we started off on but a -2 head ball was not stable.  We dislocated the hip over the trial components.  We then placed the real Actis femoral component with high offset size 6 and the real 36+1.5 ceramic head ball and again reduces and pelvis it was very stable on clinical exam as well as assessment radiographically of offset and leg length.  The soft tissue was then irrigated with normal same solution.  The joint capsule was closed with interrupted #1 Ethibond suture followed by normal Vicryl to close the tensor fascia.  0 Vicryl is used to close deep tissue and 2-0 Vicryl was used to scab tissue.  Skin was closed with staples.  Aquacel dressing was applied.  The patient was awakened, extubated and In-N-Out catheterization was performed prior to taking the patient to the recovery room.  Almeda Rummer, PA-C did assist in the entire case and beginning in and her assistance was critical and medically necessary for soft tissue management and retraction, helping guide implant placement and a layered closure of the wound. "

## 2024-08-29 NOTE — Discharge Instructions (Signed)

## 2024-08-29 NOTE — Transfer of Care (Signed)
 Immediate Anesthesia Transfer of Care Note  Patient: Carlos Grant  Procedure(s) Performed: ARTHROPLASTY, HIP, TOTAL, ANTERIOR APPROACH (Right: Hip)  Patient Location: PACU  Anesthesia Type:GA combined with regional for post-op pain  Level of Consciousness: drowsy and patient cooperative  Airway & Oxygen Therapy: Patient Spontanous Breathing  Post-op Assessment: Report given to RN and Post -op Vital signs reviewed and stable  Post vital signs: Reviewed and stable  Last Vitals:  Vitals Value Taken Time  BP 121/76 08/29/24 10:23  Temp    Pulse    Resp 11 08/29/24 10:24  SpO2    Vitals shown include unfiled device data.  Last Pain:  Vitals:   08/29/24 0644  TempSrc: Oral  PainSc: 0-No pain         Complications: No notable events documented.

## 2024-08-29 NOTE — Anesthesia Procedure Notes (Signed)
 Procedure Name: LMA Insertion Date/Time: 08/29/2024 9:11 AM  Performed by: Nanci Riis, CRNAPre-anesthesia Checklist: Patient identified, Emergency Drugs available, Suction available, Timeout performed and Patient being monitored Patient Re-evaluated:Patient Re-evaluated prior to induction Oxygen Delivery Method: Circle system utilized Preoxygenation: Pre-oxygenation with 100% oxygen Induction Type: IV induction LMA: LMA inserted LMA Size: 4.0 Number of attempts: 1 Placement Confirmation: positive ETCO2 and breath sounds checked- equal and bilateral Tube secured with: Tape Dental Injury: Teeth and Oropharynx as per pre-operative assessment

## 2024-08-29 NOTE — Anesthesia Postprocedure Evaluation (Signed)
"   Anesthesia Post Note  Patient: Carlos Grant  Procedure(s) Performed: ARTHROPLASTY, HIP, TOTAL, ANTERIOR APPROACH (Right: Hip)     Patient location during evaluation: Nursing Unit Anesthesia Type: Spinal Level of consciousness: oriented and awake and alert Pain management: pain level controlled Vital Signs Assessment: post-procedure vital signs reviewed and stable Respiratory status: spontaneous breathing and respiratory function stable Cardiovascular status: blood pressure returned to baseline and stable Postop Assessment: no headache, no backache, no apparent nausea or vomiting and patient able to bend at knees Anesthetic complications: no   No notable events documented.  Last Vitals:  Vitals:   08/29/24 1340 08/29/24 1350  BP:    Pulse: 91 92  Resp:    Temp:    SpO2: 99% 99%    Last Pain:  Vitals:   08/29/24 1350  TempSrc:   PainSc: 8                  Layne Lebon A Anagha Loseke      "

## 2024-09-01 ENCOUNTER — Encounter (HOSPITAL_COMMUNITY): Payer: Self-pay | Admitting: Orthopaedic Surgery

## 2024-09-08 ENCOUNTER — Other Ambulatory Visit: Payer: Self-pay | Admitting: Orthopaedic Surgery

## 2024-09-08 ENCOUNTER — Telehealth: Payer: Self-pay | Admitting: Orthopaedic Surgery

## 2024-09-08 MED ORDER — TIZANIDINE HCL 4 MG PO TABS: 4.0000 mg | ORAL_TABLET | Freq: Four times a day (QID) | ORAL | 0 refills | Status: AC | PRN

## 2024-09-08 MED ORDER — HYDROCODONE-ACETAMINOPHEN 5-325 MG PO TABS: 1.0000 | ORAL_TABLET | Freq: Four times a day (QID) | ORAL | 0 refills | Status: DC | PRN

## 2024-09-08 NOTE — Telephone Encounter (Signed)
 Pt called saying that he needs a refill of Hydrocodone /Acetaminophen . Pharmacy is [facility] on [address]. Call back number is [phone]

## 2024-09-11 ENCOUNTER — Ambulatory Visit: Admitting: Orthopaedic Surgery

## 2024-09-11 DIAGNOSIS — Z96641 Presence of right artificial hip joint: Secondary | ICD-10-CM

## 2024-09-11 NOTE — Progress Notes (Signed)
 The patient is here today for his first postoperative visit status post a right total hip replacement.  He is an active 64 year old gentleman.  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  He is doing well overall.  On exam his right hip incision looks good so staples and removed and Steri-Strips applied.  There is no significant seroma.  His leg lengths are near equal.  He will slowly increase his activities as comfort allows.  He is already driven and he is ambulate with just a cane.  We will see him back in a month to see how he is doing overall.

## 2024-09-23 ENCOUNTER — Telehealth: Payer: Self-pay | Admitting: Orthopaedic Surgery

## 2024-09-23 ENCOUNTER — Other Ambulatory Visit: Payer: Self-pay | Admitting: Orthopaedic Surgery

## 2024-09-23 MED ORDER — HYDROCODONE-ACETAMINOPHEN 5-325 MG PO TABS: 1.0000 | ORAL_TABLET | Freq: Four times a day (QID) | ORAL | 0 refills | Status: AC | PRN

## 2024-09-23 NOTE — Telephone Encounter (Signed)
 Patient called. Would like a refill on his pain medication.

## 2024-10-09 ENCOUNTER — Encounter: Admitting: Orthopaedic Surgery

## 2024-10-15 ENCOUNTER — Ambulatory Visit: Admitting: Physician Assistant
